# Patient Record
Sex: Female | Born: 1980
Health system: Southern US, Community
[De-identification: ages and names within clinical notes are randomized; demographics above are authoritative.]

## PROBLEM LIST (undated history)

## (undated) DIAGNOSIS — T7840XA Allergy, unspecified, initial encounter: Secondary | ICD-10-CM

## (undated) DIAGNOSIS — Z8619 Personal history of other infectious and parasitic diseases: Secondary | ICD-10-CM

## (undated) DIAGNOSIS — F419 Anxiety disorder, unspecified: Secondary | ICD-10-CM

## (undated) DIAGNOSIS — K219 Gastro-esophageal reflux disease without esophagitis: Secondary | ICD-10-CM

## (undated) DIAGNOSIS — O139 Gestational [pregnancy-induced] hypertension without significant proteinuria, unspecified trimester: Secondary | ICD-10-CM

## (undated) DIAGNOSIS — F32A Depression, unspecified: Secondary | ICD-10-CM

## (undated) DIAGNOSIS — D649 Anemia, unspecified: Secondary | ICD-10-CM

## (undated) HISTORY — DX: Personal history of other infectious and parasitic diseases: Z86.19

## (undated) HISTORY — PX: OTHER SURGICAL HISTORY: SHX169

## (undated) HISTORY — DX: Anxiety disorder, unspecified: F41.9

## (undated) HISTORY — PX: MELANOMA EXCISION: SHX5266

## (undated) HISTORY — DX: Anemia, unspecified: D64.9

## (undated) HISTORY — DX: Gestational (pregnancy-induced) hypertension without significant proteinuria, unspecified trimester: O13.9

## (undated) HISTORY — DX: Depression, unspecified: F32.A

## (undated) HISTORY — DX: Gastro-esophageal reflux disease without esophagitis: K21.9

## (undated) HISTORY — DX: Allergy, unspecified, initial encounter: T78.40XA

---

## 2005-11-16 ENCOUNTER — Emergency Department (HOSPITAL_COMMUNITY): Admission: EM | Admit: 2005-11-16 | Discharge: 2005-11-16 | Payer: Self-pay | Admitting: Emergency Medicine

## 2006-10-01 IMAGING — CT CT ABDOMEN W/ CM
2 of 5 series · 17 of 46 positions shown, 19 images · IV contrast (APPLIED)
Comparison: none

CLINICAL DATA: Right flank pain and right lower quadrant pain.  Nausea and vomiting.  Assess for appendicitis.  
 ABDOMEN CT WITH CONTRAST:
TECHNIQUE: Multidetector CT imaging of the abdomen was performed following the standard protocol during bolus administration of intravenous contrast.
 Contrast:  100 ml Omnipaque 300.
TECHNIQUE: Multidetector CT imaging of the pelvis was performed following the standard protocol during bolus administration of intravenous contrast.

[Series 2: abd/pelv with 5.0 b31f st · axial · 0.61mm/px · z∈[-480,-80]mm · 14 of 90 slices shown, 16 images]
[im 5/90  soft-tissue]
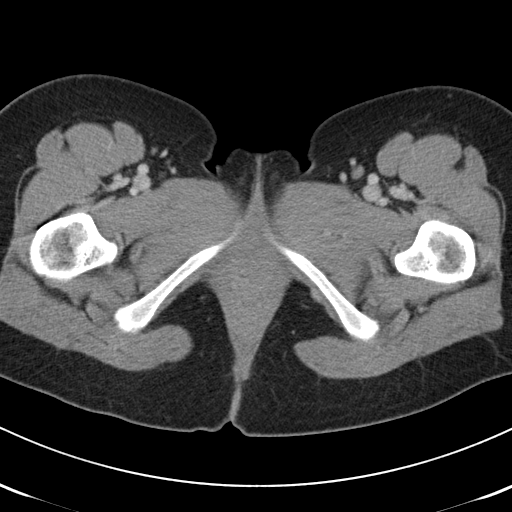
[im 5/90  bone]
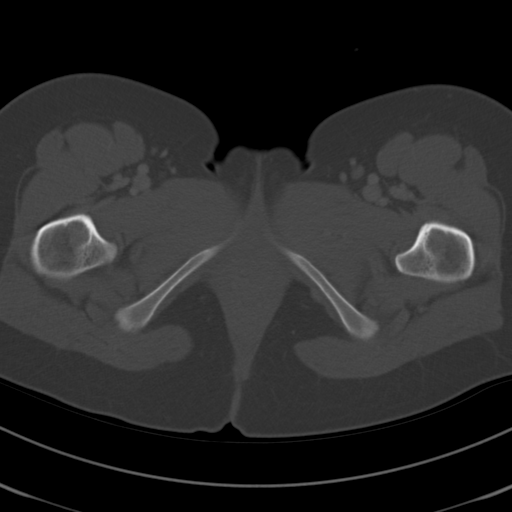
[im 14/90  soft-tissue]
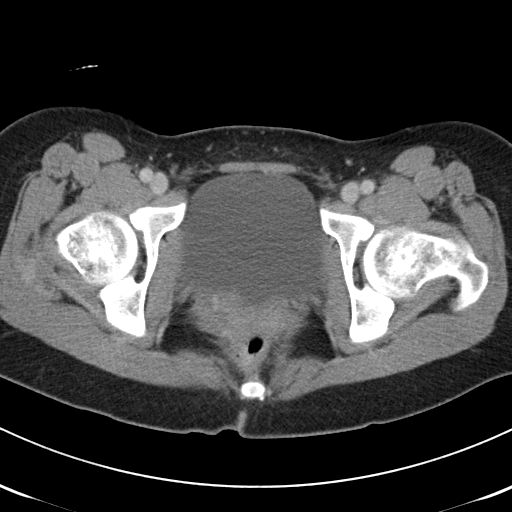
[im 18/90  soft-tissue]
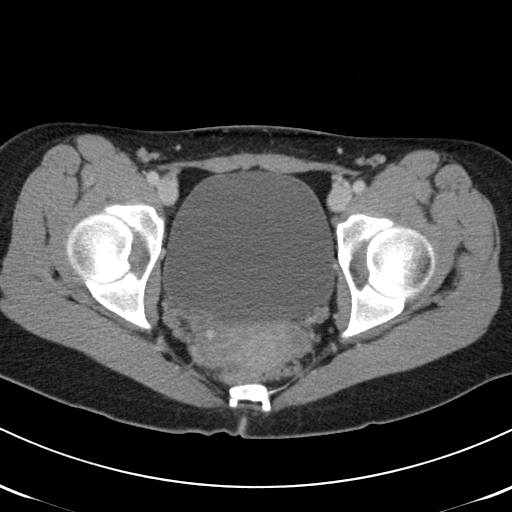
[im 23/90  soft-tissue]
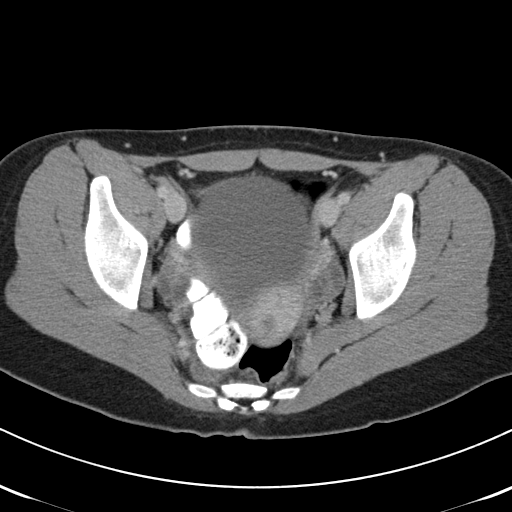
[im 32/90  soft-tissue]
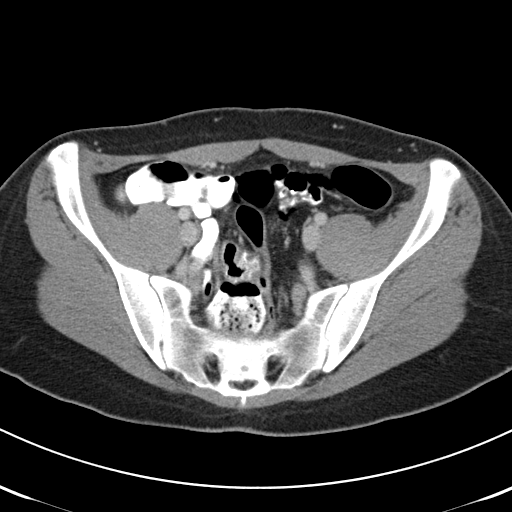
[im 36/90  soft-tissue]
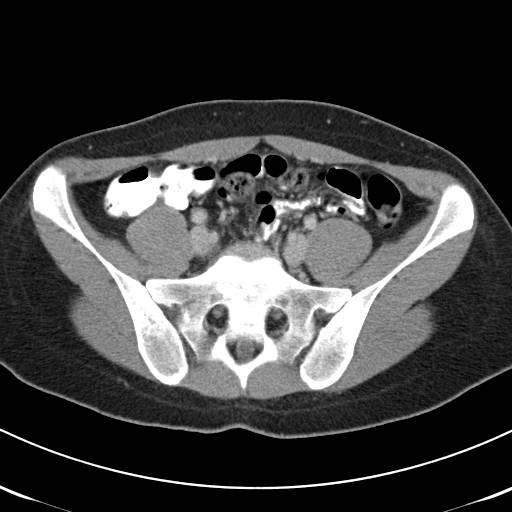
[im 41/90  soft-tissue]
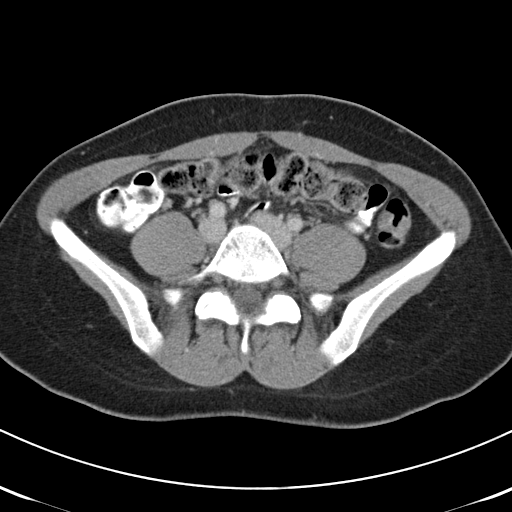
[im 49/90  soft-tissue]
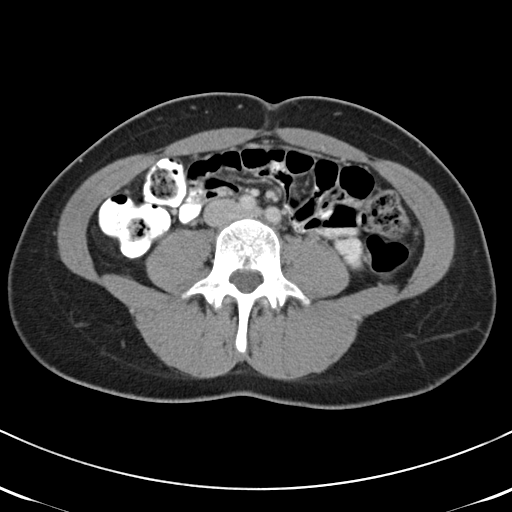
[im 54/90  soft-tissue]
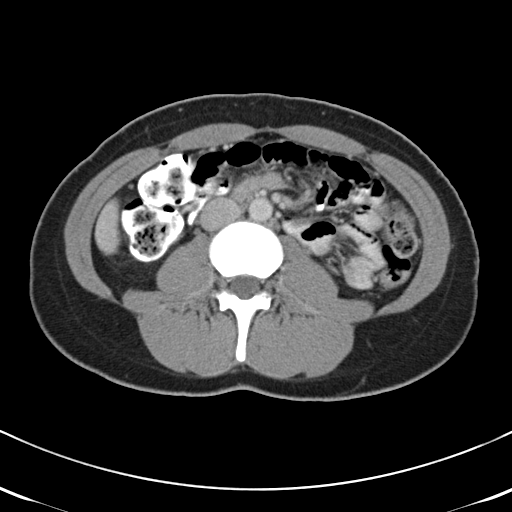
[im 54/90  bone]
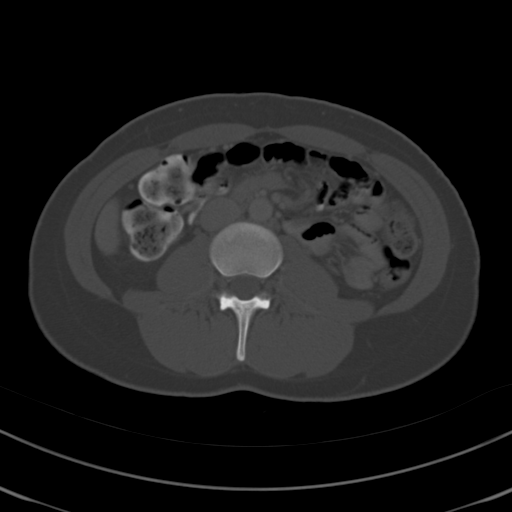
[im 58/90  soft-tissue]
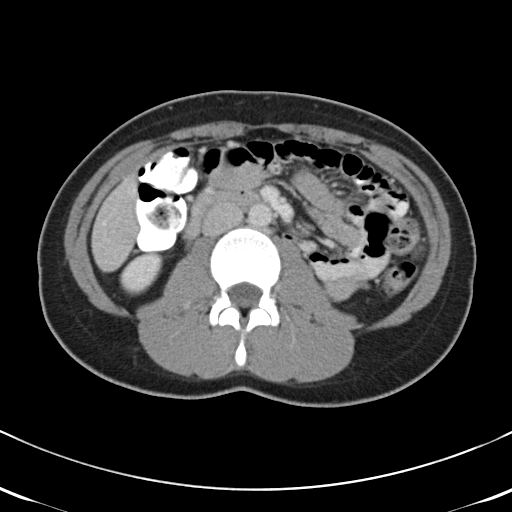
[im 67/90  soft-tissue]
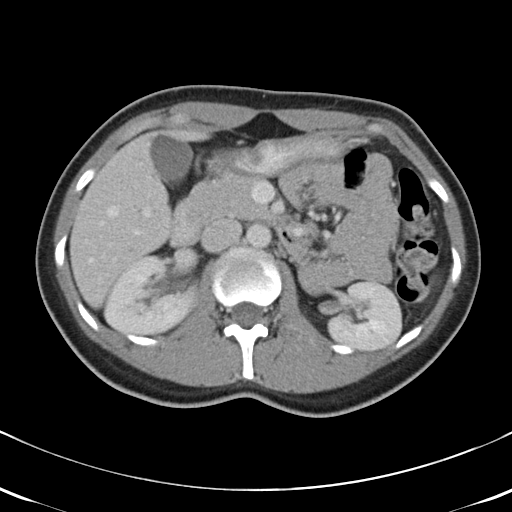
[im 72/90  soft-tissue]
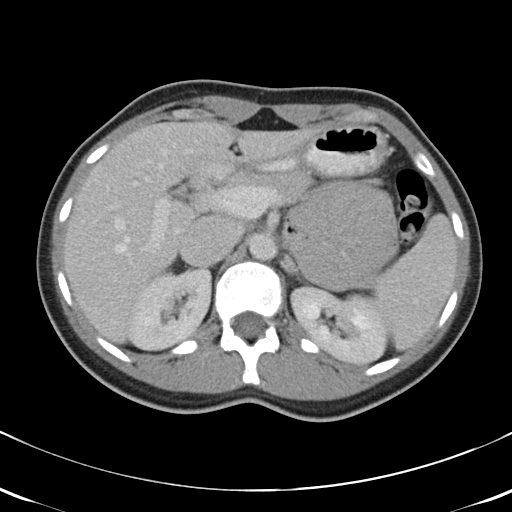
[im 76/90  soft-tissue]
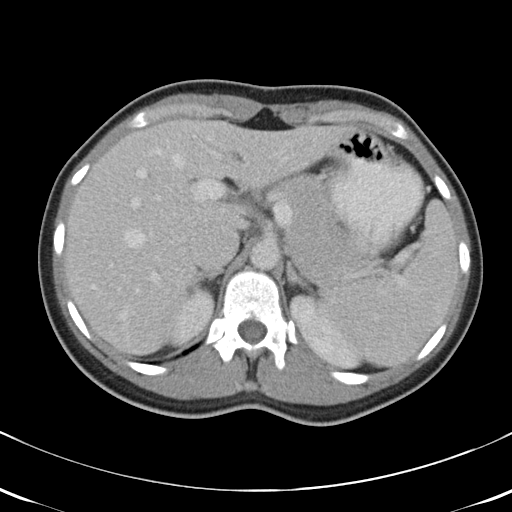
[im 85/90  soft-tissue]
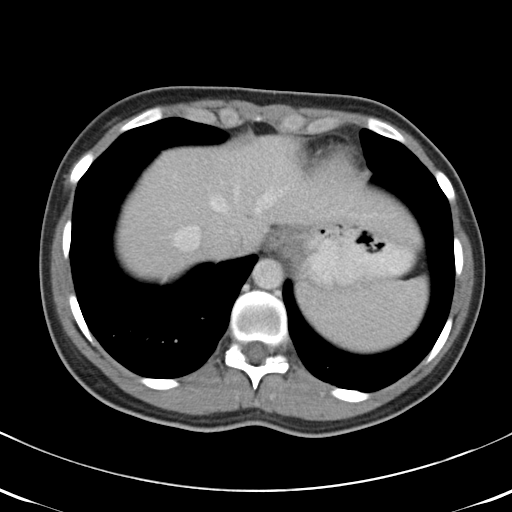

[Series 602: cor thins · coronal · 0.87mm/px · 3 of 98 slices shown]
[im 33/98  soft-tissue]
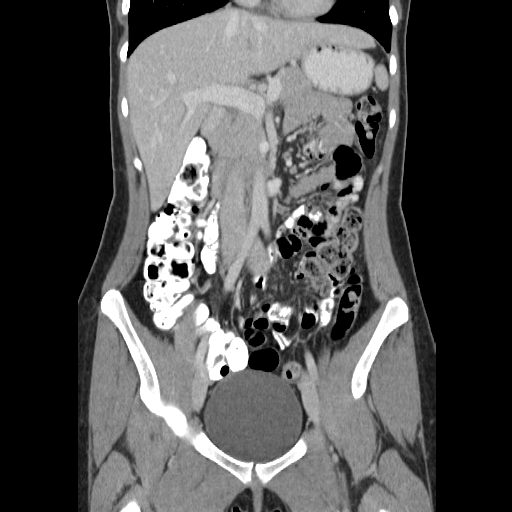
[im 44/98  soft-tissue]
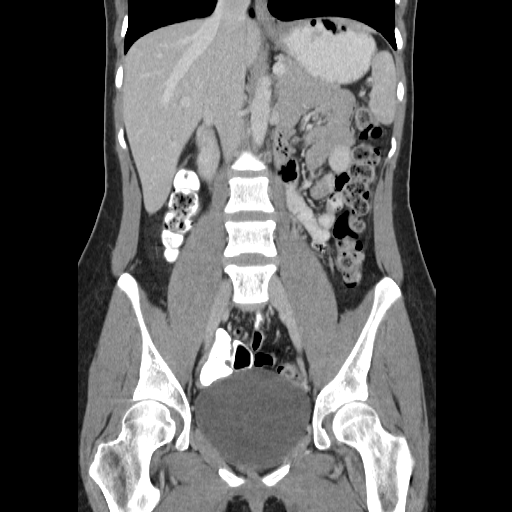
[im 54/98  soft-tissue]
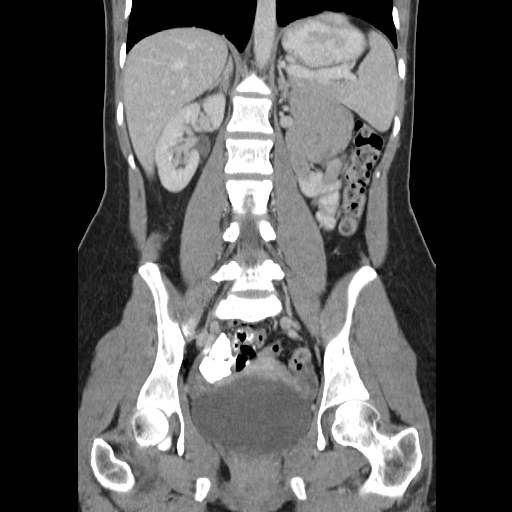

[17 of 46 positions shown; findings below may reference images not displayed]

FINDINGS: Lung bases are clear.  No pleural or pericardial fluid.  The liver has a normal appearance without focal lesions or biliary ductal dilatation.  No calcified gallstones.  The spleen is normal.  The pancreas is normal.  The adrenal glands are normal.  The kidneys are normal except for a nonobstructive stone in the upper pole on the right.  This measures about 2 to 3 mm.  Aorta and IVC are normal.  No free fluid or air.  No bowel pathology.
IMPRESSION: Normal CT scan of the abdomen with the exception of a non-obstructing 3 mm stone in the upper pole of the right kidney.  
 PELVIS CT WITH CONTRAST:
FINDINGS: There is a 3 to 4 mm calcification in the region of the UVJ on the right, which probably represents a stone.  This is not resulting in much hydroureteronephrosis however.  The uterus and adnexal regions are unremarkable.  There is no sign of pancreatitis.  There is a small amount of free fluid in the cul-de-sac.
IMPRESSION: 1.  3 to 4 mm stone in the region of the right UVJ.  No appreciable hydroureteronephrosis however.  
 2.  Small amount of free fluid in the cul-de-sac.  
 3.  No positive evidence of appendicitis.

## 2009-05-11 DIAGNOSIS — C801 Malignant (primary) neoplasm, unspecified: Secondary | ICD-10-CM

## 2009-05-11 HISTORY — DX: Malignant (primary) neoplasm, unspecified: C80.1

## 2009-08-09 LAB — CONVERTED CEMR LAB: Pap Smear: NORMAL

## 2010-02-26 ENCOUNTER — Ambulatory Visit: Payer: Self-pay | Admitting: Family Medicine

## 2010-02-26 DIAGNOSIS — E079 Disorder of thyroid, unspecified: Secondary | ICD-10-CM | POA: Insufficient documentation

## 2010-02-26 DIAGNOSIS — F988 Other specified behavioral and emotional disorders with onset usually occurring in childhood and adolescence: Secondary | ICD-10-CM | POA: Insufficient documentation

## 2010-02-26 DIAGNOSIS — C439 Malignant melanoma of skin, unspecified: Secondary | ICD-10-CM | POA: Insufficient documentation

## 2010-03-03 LAB — CONVERTED CEMR LAB: TSH: 1.49 microintl units/mL (ref 0.35–5.50)

## 2010-06-04 ENCOUNTER — Ambulatory Visit
Admission: RE | Admit: 2010-06-04 | Discharge: 2010-06-04 | Payer: Self-pay | Source: Home / Self Care | Attending: Family Medicine | Admitting: Family Medicine

## 2010-06-04 DIAGNOSIS — R21 Rash and other nonspecific skin eruption: Secondary | ICD-10-CM | POA: Insufficient documentation

## 2010-06-04 DIAGNOSIS — J069 Acute upper respiratory infection, unspecified: Secondary | ICD-10-CM | POA: Insufficient documentation

## 2010-06-10 NOTE — Assessment & Plan Note (Signed)
Summary: new to [email or web address].cbs   Vital Signs:  Patient profile:   30 year old female Height:      66.25 inches Weight:      150 pounds BMI:     24.12 Pulse rate:   88 / minute BP sitting:   116 / 70  (left arm)  Vitals Entered By: Doristine Devoid CMA (February 26, 2010 3:28 PM) CC: NEW EST- would like to have thyroid checked   History of Present Illness: 30 yo woman here today to establish care.  GYN- [facility].  Psych- Dr Tora Duck (ADD)  Oncologist- Dr Tama Gander.  Derm- [facility].  1) Melanoma- dx'd 5/11.  s/p surgery.  no chemo or radiation required.  following closely w/ derm.  2) ADD- tx'd w/ Ritalin.  sxs well controlled.  follows w/ psych  3) Elevated T4 (13.9)- found on labs run by Psych.  told to f/u w/ PCP.  rare palpitations.  tests were run due to insomnia.  TSH normal at 1.577.  Paternal Aunt w/ hyperthyroid.  Preventive Screening-Counseling & Management  Alcohol-Tobacco     Alcohol drinks/day: <1     Smoking Status: never  Caffeine-Diet-Exercise     Does Patient Exercise: yes      Sexual History:  currently monogamous.        Drug Use:  never.    Current Medications (verified): 1)  Yaz 3-0.02 Mg Tabs (Drospirenone-Ethinyl Estradiol) .... Take One Tablet Daily 2)  Ritalin 10 Mg Tabs (Methylphenidate Hcl) .... Take One Tablet Daily 3)  Propranolol Hcl 10 Mg Tabs (Propranolol Hcl) .... Take One Tablet Daily  Allergies (verified): No Known Drug Allergies  Past History:  Past Medical History: hx of chicken pox Melanoma ADD  Past Surgical History: melanoma removal wisdom teeth removed  Family History: Family History of Arthritis Family History Diabetes 1st degree relative Family History High cholesterol Family History Lung cancer- paternal grandfather  Social History: in a relationship works at [employer] in school for masters in public healthSmoking Status:  never Does Patient Exercise:  yes Drug Use:  never Sexual History:  currently  monogamous  Review of Systems      See HPI  Physical Exam  General:  Well-developed,well-nourished,in no acute distress; alert,appropriate and cooperative throughout examination Head:  Normocephalic and atraumatic without obvious abnormalities. No apparent alopecia or balding. Neck:  No deformities, masses, or tenderness noted. Lungs:  Normal respiratory effort, chest expands symmetrically. Lungs are clear to auscultation, no crackles or wheezes. Heart:  Normal rate and regular rhythm. S1 and S2 normal without gallop, murmur, click, rub or other extra sounds. Pulses:  +2 carotid, radial, DP Extremities:  no C/C/E Neurologic:  alert & oriented X3, cranial nerves II-XII intact, and gait normal.   Psych:  Cognition and judgment appear intact. Alert and cooperative with normal attention span and concentration. No apparent delusions, illusions, hallucinations   Impression & Recommendations:  Problem # 1:  UNSPECIFIED DISORDER OF THYROID (ICD-246.9) Assessment New pt's T4 was elevated on recent labs but mildly so and T3 and TSH were normal.  repeat labs today to see if endo referral or additional w/u is needed. Orders: Venipuncture (19147) Specimen Handling (82956) TLB-TSH (Thyroid Stimulating Hormone) (84443-TSH) TLB-T3, Free (Triiodothyronine) (84481-T3FREE) TLB-T4 (Thyrox), Free [phone])  Problem # 2:  MELANOMA (ICD-172.9) Assessment: New following regularly w/ derm.  no chemo or radiation required.  Problem # 3:  ADD (ICD-314.00) Assessment: New sxs well controlled.  currently seeing psych.  Complete Medication List: 1)  Yaz 3-0.02 Mg Tabs (Drospirenone-ethinyl estradiol) .... Take one tablet daily 2)  Ritalin 10 Mg Tabs (Methylphenidate hcl) .... Take one tablet daily 3)  Propranolol Hcl 10 Mg Tabs (Propranolol hcl) .... Take one tablet daily  Patient Instructions: 1)  Follow up in 2-3 months to repeat blood work (assuming that this lab work is normal) 2)  We'll notify  you of your lab results 3)  Please call with any questions or concerns 4)  Welcome!  We're glad to have you!!!   Orders Added: 1)  Venipuncture [16109] 2)  Specimen Handling [99000] 3)  TLB-TSH (Thyroid Stimulating Hormone) [84443-TSH] 4)  TLB-T3, Free (Triiodothyronine) [60454-U9WJXB] 5)  TLB-T4 (Thyrox), Free [14782-NF6O] 6)  New Patient Level III [13086]     Preventive Care Screening  Pap Smear:    Date:  08/09/2009    Results:  normal   Last Tetanus Booster:    Date:  12/09/2005    Results:  Historical

## 2010-06-18 NOTE — Assessment & Plan Note (Signed)
Summary: cold/kn   Vital Signs:  Patient profile:   30 year old female Weight:      151 pounds BMI:     24.28 Temp:     98.3 degrees F oral BP sitting:   104 / 60  (left arm)  Vitals Entered By: Doristine Devoid CMA (June 04, 2010 9:39 AM) CC: cough and sinus congetion x4 days and rash on arm and legs    History of Present Illness: 30 yo woman here today for 5 days of cold sxs.  started w/ sore throat.  progressed to nasal congestion, cough, sneezing, headaches.  having facial pressure.  low grade temps.  + sick contacts.  cough is productive.  taking OCT multisymptom cold med w/out relief.  rash- appeared today.  on R lower leg and R elbow.  has itching on the leg, none on the elbow.  no exposure to pets.  no change in detergents, no new foods.  took vit C supplement last night for 1st time.  Current Medications (verified): 1)  Yaz 3-0.02 Mg Tabs (Drospirenone-Ethinyl Estradiol) .... Take One Tablet Daily 2)  Ritalin 10 Mg Tabs (Methylphenidate Hcl) .... Take One Tablet Daily 3)  Propranolol Hcl 10 Mg Tabs (Propranolol Hcl) .... Take One Tablet Daily  Allergies (verified): No Known Drug Allergies  Review of Systems      See HPI  Physical Exam  General:  Well-developed,well-nourished,in no acute distress; alert,appropriate and cooperative throughout examination Head:  Normocephalic and atraumatic without obvious abnormalities. No apparent alopecia or balding.  no TTP over sinuses Eyes:  no injxn or inflammation Ears:  External ear exam shows no significant lesions or deformities.  Otoscopic examination reveals clear canals, tympanic membranes are intact bilaterally without bulging, retraction, inflammation or discharge. Hearing is grossly normal bilaterally. Nose:  + congestion Mouth:  + PND, otherwise normal Neck:  No deformities, masses, or tenderness noted. Lungs:  Normal respiratory effort, chest expands symmetrically. Lungs are clear to auscultation, no crackles or  wheezes.  + dry cough Heart:  Normal rate and regular rhythm. S1 and S2 normal without gallop, murmur, click, rub or other extra sounds. Skin:  fine macular-papular rash on R lateral calf and R elbow.  no excoriations, no vesicles. Cervical Nodes:  No lymphadenopathy noted   Impression & Recommendations:  Problem # 1:  URI (ICD-465.9) Assessment New pt's sxs consistent w/ viral illness.  cough meds as needed.  reviewed supportive care and red flags that should prompt return.  Pt expresses understanding and is in agreement w/ this plan. Her updated medication list for this problem includes:    Cheratussin Ac 100-10 Mg/25ml Syrp (Guaifenesin-codeine) .Marland Kitchen... 1-2 tsps q4-6 as needed.  will cause drowsiness.  disp    Tessalon 200 Mg Caps (Benzonatate) .Marland Kitchen... Take one capsule by mouth three times a day as needed for cough  Problem # 2:  RASH-NONVESICULAR (ICD-782.1) Assessment: New cause of rash not readily identifiable.  start steroid cream on affected areas. Her updated medication list for this problem includes:    Triamcinolone Acetonide 0.1 % Oint (Triamcinolone acetonide) .Marland Kitchen... Apply to affected area two times a day.  disp 1 large tube  Complete Medication List: 1)  Yaz 3-0.02 Mg Tabs (Drospirenone-ethinyl estradiol) .... Take one tablet daily 2)  Ritalin 10 Mg Tabs (Methylphenidate hcl) .... Take one tablet daily 3)  Propranolol Hcl 10 Mg Tabs (Propranolol hcl) .... Take one tablet daily 4)  Triamcinolone Acetonide 0.1 % Oint (Triamcinolone acetonide) .... Apply to affected  area two times a day.  disp 1 large tube 5)  Cheratussin Ac 100-10 Mg/55ml Syrp (Guaifenesin-codeine) .Marland Kitchen.. 1-2 tsps q4-6 as needed.  will cause drowsiness.  disp 6)  Tessalon 200 Mg Caps (Benzonatate) .... Take one capsule by mouth three times a day as needed for cough  Patient Instructions: 1)  Follow up as needed. 2)  Use the triamcinolone on the rash 3)  Add Mucinex to thin your congestion 4)  Use the  cough meds as needed 5)  Drink plenty of fluids 6)  REST! 7)  Tylenol or ibuprofen for fever or headaches 8)  Call with any questions or concerns 9)  Hang in there! Prescriptions: TESSALON 200 MG CAPS (BENZONATATE) Take one capsule by mouth three times a day as needed for cough  #60 x 0   Entered and Authorized by:   Neena Rhymes MD   Signed by:   Neena Rhymes MD on 06/04/2010   Method used:   Electronically to        [id number] (retail)       [address]       [place], [place]  [address]       Ph: [phone] or [phone]       Fax: [phone]   RxID:   [id number] CHERATUSSIN AC 100-10 MG/5ML SYRP (GUAIFENESIN-CODEINE) 1-2 tsps Q4-6 as needed.  will cause drowsiness.  disp  #150 x 0   Entered and Authorized by:   Neena Rhymes MD   Signed by:   Neena Rhymes MD on 06/04/2010   Method used:   Print then Give to Patient   RxID:   [id number] TRIAMCINOLONE ACETONIDE 0.1 % OINT (TRIAMCINOLONE ACETONIDE) apply to affected area two times a day.  disp 1 large tube  #1 x 0   Entered and Authorized by:   Neena Rhymes MD   Signed by:   Neena Rhymes MD on 06/04/2010   Method used:   Electronically to        [id number] (retail)       [address]       [place], [place]  [address]       Ph: [phone] or [phone]       Fax: [phone]   RxID:   [id number]    Orders Added: 1)  Est. Patient Level III [41962]

## 2010-09-25 ENCOUNTER — Emergency Department (HOSPITAL_COMMUNITY)
Admission: EM | Admit: 2010-09-25 | Discharge: 2010-09-26 | Disposition: A | Discharge status: Home / Self Care | Payer: BC Managed Care – PPO | Attending: Emergency Medicine | Admitting: Emergency Medicine

## 2010-09-25 DIAGNOSIS — R509 Fever, unspecified: Secondary | ICD-10-CM | POA: Insufficient documentation

## 2010-09-25 DIAGNOSIS — R112 Nausea with vomiting, unspecified: Secondary | ICD-10-CM | POA: Insufficient documentation

## 2010-09-25 DIAGNOSIS — Z87442 Personal history of urinary calculi: Secondary | ICD-10-CM | POA: Insufficient documentation

## 2010-09-25 DIAGNOSIS — N12 Tubulo-interstitial nephritis, not specified as acute or chronic: Secondary | ICD-10-CM | POA: Insufficient documentation

## 2010-09-25 DIAGNOSIS — R109 Unspecified abdominal pain: Secondary | ICD-10-CM | POA: Insufficient documentation

## 2010-09-25 LAB — DIFFERENTIAL
Basophils Absolute: 0 10*3/uL (ref 0.0–0.1)
Basophils Relative: 0 % (ref 0–1)
Lymphocytes Relative: 6 % — ABNORMAL LOW (ref 12–46)
Lymphs Abs: 0.9 10*3/uL (ref 0.7–4.0)
Monocytes Absolute: 0.6 10*3/uL (ref 0.1–1.0)
Monocytes Relative: 4 % (ref 3–12)
Neutrophils Relative %: 90 % — ABNORMAL HIGH (ref 43–77)

## 2010-09-25 LAB — COMPREHENSIVE METABOLIC PANEL
ALT: 18 U/L (ref 0–35)
AST: 23 U/L (ref 0–37)
Alkaline Phosphatase: 97 U/L (ref 39–117)
BUN: 9 mg/dL (ref 6–23)
CO2: 25 mEq/L (ref 19–32)
Calcium: 9.5 mg/dL (ref 8.4–10.5)
Chloride: 99 mEq/L (ref 96–112)
Potassium: 4 mEq/L (ref 3.5–5.1)
Sodium: 135 mEq/L (ref 135–145)
Total Bilirubin: 0.6 mg/dL (ref 0.3–1.2)
Total Protein: 7.8 g/dL (ref 6.0–8.3)

## 2010-09-25 LAB — CBC
HCT: 37.7 % (ref 36.0–46.0)
MCHC: 32.1 g/dL (ref 30.0–36.0)
MCV: 83.2 fL (ref 78.0–100.0)
Platelets: 129 10*3/uL — ABNORMAL LOW (ref 150–400)
RBC: 4.53 MIL/uL (ref 3.87–5.11)
WBC: 14.6 10*3/uL — ABNORMAL HIGH (ref 4.0–10.5)

## 2010-09-25 LAB — POCT PREGNANCY, URINE: Preg Test, Ur: NEGATIVE

## 2010-09-25 LAB — URINALYSIS, ROUTINE W REFLEX MICROSCOPIC
Bilirubin Urine: NEGATIVE
Nitrite: POSITIVE — AB
Protein, ur: 30 mg/dL — AB
pH: 5.5 (ref 5.0–8.0)

## 2010-09-25 LAB — URINE MICROSCOPIC-ADD ON

## 2010-10-02 ENCOUNTER — Other Ambulatory Visit (HOSPITAL_COMMUNITY): Payer: Self-pay | Admitting: Urology

## 2010-10-02 DIAGNOSIS — N12 Tubulo-interstitial nephritis, not specified as acute or chronic: Secondary | ICD-10-CM

## 2010-10-07 ENCOUNTER — Ambulatory Visit (HOSPITAL_COMMUNITY)
Admission: RE | Admit: 2010-10-07 | Discharge: 2010-10-07 | Disposition: A | Discharge status: Home / Self Care | Payer: BC Managed Care – PPO | Source: Ambulatory Visit | Attending: Urology | Admitting: Urology

## 2010-10-07 DIAGNOSIS — N12 Tubulo-interstitial nephritis, not specified as acute or chronic: Secondary | ICD-10-CM

## 2010-10-07 DIAGNOSIS — N390 Urinary tract infection, site not specified: Secondary | ICD-10-CM | POA: Insufficient documentation

## 2010-10-07 MED ORDER — DIATRIZOATE MEGLUMINE 18 % UR SOLN: 400.0000 mL | Freq: Once | URETHRAL | Status: AC | PRN

## 2012-09-20 ENCOUNTER — Ambulatory Visit (INDEPENDENT_AMBULATORY_CARE_PROVIDER_SITE_OTHER): Payer: BC Managed Care – PPO | Admitting: Family Medicine

## 2012-09-20 ENCOUNTER — Encounter: Payer: Self-pay | Admitting: Family Medicine

## 2012-09-20 VITALS — BP 116/68 | HR 88 | Temp 99.6°F | Wt 152.8 lb

## 2012-09-20 DIAGNOSIS — R059 Cough, unspecified: Secondary | ICD-10-CM

## 2012-09-20 DIAGNOSIS — J019 Acute sinusitis, unspecified: Secondary | ICD-10-CM

## 2012-09-20 DIAGNOSIS — R05 Cough: Secondary | ICD-10-CM

## 2012-09-20 MED ORDER — MOMETASONE FUROATE 50 MCG/ACT NA SUSP: 2.0000 | Freq: Every day | NASAL | Status: DC

## 2012-09-20 MED ORDER — GUAIFENESIN-CODEINE 100-10 MG/5ML PO SYRP: 5.0000 mL | ORAL_SOLUTION | Freq: Three times a day (TID) | ORAL | Status: DC | PRN

## 2012-09-20 MED ORDER — CEFUROXIME AXETIL 500 MG PO TABS: 500.0000 mg | ORAL_TABLET | Freq: Two times a day (BID) | ORAL | Status: AC

## 2012-09-20 NOTE — Patient Instructions (Signed)

## 2012-09-20 NOTE — Progress Notes (Signed)
  Subjective:     Shannon Richardson is a 32 y.o. female who presents for evaluation of sinus pain. Symptoms include: congestion, facial pain, fevers, headaches, nasal congestion and sinus pressure. Onset of symptoms was 4 days ago. Symptoms have been gradually worsening since that time. Past history is significant for no history of pneumonia or bronchitis. Patient is a non-smoker.  The following portions of the patient's history were reviewed and updated as appropriate: allergies, current medications, past family history, past medical history, past social history, past surgical history and problem list.  Review of Systems Pertinent items are noted in HPI.   Objective:    BP 116/68  Pulse 88  Temp(Src) 99.6 F (37.6 C) (Oral)  Wt 152 lb 12.8 oz (69.31 kg)  BMI 24.47 kg/m2  SpO2 96% General appearance: alert, cooperative, appears stated age and no distress Ears: normal TM's and external ear canals both ears Nose: green discharge, moderate congestion, turbinates red, swollen, sinus tenderness bilateral Throat: lips, mucosa, and tongue normal; teeth and gums normal Neck: mild anterior cervical adenopathy, supple, symmetrical, trachea midline and thyroid not enlarged, symmetric, no tenderness/mass/nodules Lungs: clear to auscultation bilaterally Heart: S1, S2 normal    Assessment:    Acute bacterial sinusitis.    Plan:    Nasal steroids per medication orders. Antihistamines per medication orders. Ceftin per medication orders. f/u prn

## 2013-08-10 ENCOUNTER — Ambulatory Visit (INDEPENDENT_AMBULATORY_CARE_PROVIDER_SITE_OTHER): Payer: BC Managed Care – PPO | Admitting: Family Medicine

## 2013-08-10 ENCOUNTER — Encounter: Payer: Self-pay | Admitting: Family Medicine

## 2013-08-10 VITALS — BP 124/84 | HR 89 | Temp 98.5°F | Resp 17 | Wt 165.5 lb

## 2013-08-10 DIAGNOSIS — R5383 Other fatigue: Principal | ICD-10-CM

## 2013-08-10 DIAGNOSIS — R5381 Other malaise: Secondary | ICD-10-CM | POA: Insufficient documentation

## 2013-08-10 LAB — CBC WITH DIFFERENTIAL/PLATELET
BASOS ABS: 0 10*3/uL (ref 0.0–0.1)
Basophils Relative: 0.2 % (ref 0.0–3.0)
EOS ABS: 0.1 10*3/uL (ref 0.0–0.7)
EOS PCT: 2.2 % (ref 0.0–5.0)
HCT: 36 % (ref 36.0–46.0)
HEMOGLOBIN: 11.8 g/dL — AB (ref 12.0–15.0)
LYMPHS ABS: 1.8 10*3/uL (ref 0.7–4.0)
Lymphocytes Relative: 27.9 % (ref 12.0–46.0)
MCHC: 32.8 g/dL (ref 30.0–36.0)
MCV: 82.6 fl (ref 78.0–100.0)
MONOS PCT: 8.6 % (ref 3.0–12.0)
Monocytes Absolute: 0.6 10*3/uL (ref 0.1–1.0)
NEUTROS ABS: 3.9 10*3/uL (ref 1.4–7.7)
NEUTROS PCT: 61.1 % (ref 43.0–77.0)
PLATELETS: 218 10*3/uL (ref 150.0–400.0)
RBC: 4.36 Mil/uL (ref 3.87–5.11)
RDW: 14.4 % (ref 11.5–14.6)
WBC: 6.4 10*3/uL (ref 4.5–10.5)

## 2013-08-10 LAB — BASIC METABOLIC PANEL
BUN: 11 mg/dL (ref 6–23)
CHLORIDE: 102 meq/L (ref 96–112)
CO2: 27 mEq/L (ref 19–32)
CREATININE: 0.9 mg/dL (ref 0.4–1.2)
Calcium: 9.2 mg/dL (ref 8.4–10.5)
GFR: 73.8 mL/min (ref >60.00)
GLUCOSE: 86 mg/dL (ref 70–99)
Potassium: 4 mEq/L (ref 3.5–5.1)
Sodium: 138 mEq/L (ref 135–145)

## 2013-08-10 LAB — TSH: TSH: 1.27 u[IU]/mL (ref 0.35–5.50)

## 2013-08-10 NOTE — Progress Notes (Signed)
Pre visit review using our clinic review tool, if applicable. No additional management support is needed unless otherwise documented below in the visit note. 

## 2013-08-10 NOTE — Patient Instructions (Signed)
Follow up as needed We'll notify you of your lab results and make any changes if needed Try and get lots of rest, regular exercise to improve fatigue Call with any questions or concerns Hang in there!

## 2013-08-10 NOTE — Progress Notes (Signed)
   Subjective:    Patient ID: Shannon Richardson, female    DOB: [birth year], 33 y.o.   MRN: [id number]  HPI Pt is concerned about a cluster of symptoms- fatigue, facial acne, weight gain- 15 lbs in a year.  + constipation.  + dry skin, hair.  + family hx of hypothyroid.  Regular menstrual cycles.  Sleeping well at night.  Pt admits to mild depression but 'i'm feeling better now that it's sunny'.   Review of Systems For ROS see HPI     Objective:   Physical Exam  Vitals reviewed. Constitutional: She is oriented to person, place, and time. She appears well-developed and well-nourished. No distress.  HENT:  Head: Normocephalic and atraumatic.  Eyes: Conjunctivae and EOM are normal. Pupils are equal, round, and reactive to light.  Neck: Normal range of motion. Neck supple. No thyromegaly present.  Cardiovascular: Normal rate, regular rhythm, normal heart sounds and intact distal pulses.   No murmur heard. Pulmonary/Chest: Effort normal and breath sounds normal. No respiratory distress.  Musculoskeletal: She exhibits no edema.  Lymphadenopathy:    She has no cervical adenopathy.  Neurological: She is alert and oriented to person, place, and time.  Skin: Skin is warm and dry.  Psychiatric: She has a normal mood and affect. Her behavior is normal.          Assessment & Plan:

## 2013-08-10 NOTE — Assessment & Plan Note (Signed)
Pt's sxs possibly multifactorial- depression, hypothyroid, Vit D deficiency (since pt avoids sun due to hx of melanoma).  Check labs.  Replete prn.

## 2013-08-16 ENCOUNTER — Encounter: Payer: Self-pay | Admitting: General Practice

## 2013-08-16 LAB — VITAMIN D 1,25 DIHYDROXY
VITAMIN D 1, 25 (OH) TOTAL: 71 pg/mL (ref 18–72)
Vitamin D3 1, 25 (OH)2: 71 pg/mL

## 2014-03-15 DIAGNOSIS — F4312 Post-traumatic stress disorder, chronic: Secondary | ICD-10-CM | POA: Insufficient documentation

## 2014-03-15 DIAGNOSIS — F4011 Social phobia, generalized: Secondary | ICD-10-CM | POA: Insufficient documentation

## 2014-12-24 LAB — OB RESULTS CONSOLE GC/CHLAMYDIA
CHLAMYDIA, DNA PROBE: NEGATIVE
Gonorrhea: NEGATIVE

## 2015-02-12 LAB — OB RESULTS CONSOLE RUBELLA ANTIBODY, IGM: Rubella: IMMUNE

## 2015-02-12 LAB — OB RESULTS CONSOLE ANTIBODY SCREEN: Antibody Screen: NEGATIVE

## 2015-02-12 LAB — OB RESULTS CONSOLE RPR: RPR: NONREACTIVE

## 2015-02-12 LAB — OB RESULTS CONSOLE HEPATITIS B SURFACE ANTIGEN: Hepatitis B Surface Ag: NEGATIVE

## 2015-02-12 LAB — OB RESULTS CONSOLE ABO/RH: RH Type: NEGATIVE

## 2015-02-12 LAB — OB RESULTS CONSOLE HIV ANTIBODY (ROUTINE TESTING): HIV: NONREACTIVE

## 2015-03-08 ENCOUNTER — Other Ambulatory Visit (HOSPITAL_COMMUNITY): Payer: Self-pay | Admitting: Obstetrics & Gynecology

## 2015-03-08 DIAGNOSIS — O283 Abnormal ultrasonic finding on antenatal screening of mother: Secondary | ICD-10-CM

## 2015-03-08 DIAGNOSIS — Z3689 Encounter for other specified antenatal screening: Secondary | ICD-10-CM

## 2015-03-12 ENCOUNTER — Ambulatory Visit (HOSPITAL_COMMUNITY)
Admission: RE | Admit: 2015-03-12 | Discharge: 2015-03-12 | Disposition: A | Discharge status: Home / Self Care | Payer: BLUE CROSS/BLUE SHIELD | Source: Ambulatory Visit | Attending: Obstetrics & Gynecology | Admitting: Obstetrics & Gynecology

## 2015-03-12 ENCOUNTER — Encounter (HOSPITAL_COMMUNITY): Payer: Self-pay

## 2015-03-12 DIAGNOSIS — O358XX0 Maternal care for other (suspected) fetal abnormality and damage, not applicable or unspecified: Secondary | ICD-10-CM

## 2015-03-12 DIAGNOSIS — Z3689 Encounter for other specified antenatal screening: Secondary | ICD-10-CM

## 2015-03-12 DIAGNOSIS — Z36 Encounter for antenatal screening of mother: Secondary | ICD-10-CM | POA: Diagnosis not present

## 2015-03-12 DIAGNOSIS — Z3A21 21 weeks gestation of pregnancy: Secondary | ICD-10-CM | POA: Diagnosis not present

## 2015-03-12 DIAGNOSIS — O283 Abnormal ultrasonic finding on antenatal screening of mother: Secondary | ICD-10-CM

## 2015-03-12 DIAGNOSIS — O35HXX0 Maternal care for other (suspected) fetal abnormality and damage, fetal lower extremities anomalies, not applicable or unspecified: Secondary | ICD-10-CM

## 2015-03-14 DIAGNOSIS — O35HXX0 Maternal care for other (suspected) fetal abnormality and damage, fetal lower extremities anomalies, not applicable or unspecified: Secondary | ICD-10-CM | POA: Insufficient documentation

## 2015-03-14 DIAGNOSIS — O358XX0 Maternal care for other (suspected) fetal abnormality and damage, not applicable or unspecified: Secondary | ICD-10-CM | POA: Insufficient documentation

## 2015-03-14 NOTE — Progress Notes (Signed)
Genetic Counseling  High-Risk Gestation Note  Appointment Date:  03/12/2015 Referred By: Princess Bruins, MD Date of Birth:  [birth year]   Pregnancy History: G1P0 Estimated Date of Delivery: 07/22/15 Estimated Gestational Age: [redacted]w[redacted]d Attending: Benjaman Lobe, MD  I met with Ms. Shannon Richardson for genetic counseling because of abnormal ultrasound findings. She was accompanied by her sister to today's visit. [employer] Genetic Counseling Intern, Silva Bandy, assisted with genetic counseling under my direct supervision.   In Summary:  Ultrasound today visualized left club foot. Remaining visualized fetal anatomy visualized and within normal limits  Reviewed increased association with underlying chromosome or genetic conditions versus chance for club foot to be isolated  Patient previously had first trimester screening within normal range  Declined NIPS and amniocentesis  Declined CF carrier screening   Follow-up ultrasound scheduled for 04/23/15  Prenatal pediatric orthopedics consult scheduled for 11/08 with [place] office  We began by reviewing the ultrasound in detail. Ultrasound today visualized left club foot. Fetal bowel visualized to be prominent but not considered echogenic on today's exam. Remaining visualized fetal anatomy appeared normal. Complete ultrasound results reported separately.   We discussed that clubfoot/feet is a term that actually describes three distinct anomalies (talipes equinovarus, talipes calcaneovalgus, and metatarsus varus) and occurs in 1 in 1000 births. The most common type of clubfeet, talipes equinovarus, is characterized by forefoot adduction with supination, heel varus, and ankle equines, which cannot be brought back to a neutral position. They were counseled that clubfeet can be an isolated difference, occur as a feature of an underlying syndrome, or the result of neurological impairment. We discussed that the most likely mode of  inheritance for nonsyndromic, isolated clubfoot/feet is multifactorial. There is a known genetic component for multifactorial clubfoot, as demonstrated by twin studies; environmental factors also play a role, including infection, drugs, and intrauterine environment (oligohydramnios, fetal positioning). While the majority of cases of clubfoot/feet are isolated, it is a feature in more than 200 known genetic syndromes, including both chromosomal and single gene conditions. We reviewed chromosomes, nondisjunction and the features of Down syndrome, trisomy 7, and 11. We discussed that the risk for other chromosome aberrations is slightly increased (microdeletions, microduplications, insertions, translocations). We reviewed single gene conditions including common inheritance patterns and associated risks for recurrence. We reviewed the normal results of Ms. Markiewicz's First trimester screen and the associated reduction in risks for fetal aneuploidy (< 1 in 10,000 for Trisomies 21, 18, 13). We also reviewed her normal MSAFP screening results and the associated screen negative risks for an ONTD.   Ms. Jedlicka was counseled regarding the option of noninvasive prenatal screening (NIPS)/cell free fetal DNA (cffDNA) testing. We reviewed that although highly specific and sensitive, this testing is not considered to be diagnostic and does not detect all chromosome aberrations. We reviewed the detection and false positive rates of NIPS. We then discussed the option of amniocentesis, including the limitations, benefits, and risks. They understand that amniocentesis allows evaluation of the fetal chromosomes, but cannot detect all genetic conditions. Specifically, we discussed that single gene conditions are difficult to diagnose prenatally unless a specific condition is suspected based on additional ultrasound findings or family history. Additionally, we discussed the availability of microarray analysis, which can be performed  pre and postnatally. They were counseled that microarray analysis is a molecular based technique in which a test sample of DNA (fetal) is compared to a reference (normal) genome in order to determine if the test sample has any extra or missing  genetic information. Microarray analysis allows for the detection of genetic deletions and duplications that are 485 times smaller than those identified by routine chromosome analysis.After thoughtful consideration, Ms. Jonai Whiteman declined NIPS and amniocentesis.   We discussed the option of meeting with a pediatric orthopedic specialist to discuss expectant management and treatment of clubfeet.  Ms. Shirelle Smithey is interested in this option. We facilitated a prenatal consult with [place] office in [place], which is scheduled for 03/19/15. Follow-up ultrasound is scheduled for 04/23/15.   Both family histories were reviewed and found to be contributory for congenital cataracts for the patient's maternal half-sister's daughter. She is currently 68 years old and otherwise healthy. The patient's maternal half-sister reported that she is a carrier and that they were told it was a 1 in 4 chance for the condition to happen again. She did not remember that specific testing or name of the condition that was determined. 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 We reviewed genes and autosomal recessive inheritance. If the patient's maternal half-sister is a carrier for an autosomal recessive form of congenital cataracts, then Ms. Birmingham would have a 1 in 4 chance to also be a carrier. The father of the pregnancy would have the general population risk given no known family history and no consanguinity to Ms. Skowron. Overall recurrence risk is likely low. However, additional information is needed regarding the specific condition in order to accurately assess  recurrence risk for relatives. Carrier testing would potentially be available for Ms. Shannahan if desired and if the specific testing performed for the relatives was known.  Additionally, the father of the pregnancy reportedly has a female paternal first cousin (his maternal uncle's daughter) who has intellectual disability and other health concerns. The patient had limited information regarding the specific types of medical issues and did not know of a specific cause. She is currently in her 61's. We discussed that there can be genetic, environmental, and multifactorial causes for intellectual disabilities. The reported family history is suggestive of a non-isolated form. However, additional information is needed to assess recurrence risk for relatives.  Without further information regarding the provided family history, an accurate genetic risk cannot be calculated. Further genetic counseling is warranted if more information is obtained.  Ms. Khloei Cousineau  was provided with written information regarding cystic fibrosis (CF) including the carrier frequency and incidence in the Caucasian and African American populations, the availability of carrier testing and prenatal diagnosis if indicated.  In addition, we discussed that CF is routinely screened for as part of the [facility] newborn screening panel.  She declined CF testing today.   Ms. Contino denied exposure to environmental toxins or chemical agents. She denied the use of tobacco or street drugs. She reported having 3-4 drinks of alcohol prior to being aware of the pregnancy. She also reported taking adderall and trazodone prior to being aware of the pregnancy. The all-or-none period was discussed, meaning exposures that occur in the first 4 weeks of gestation are typically thought to either not affect the pregnancy at all or result in a miscarriage.  Prenatal alcohol exposure can increase the risk for growth delays, small head size, heart defects, eye and  facial differences, as well as behavior problems and learning disabilities. The risk of these to occur tends to increase with the amount of alcohol consumed. However, because there is no identified safe amount of alcohol in pregnancy, it  is recommended to completely avoid alcohol in pregnancy. Given the reported amount of exposure, risk for associated effects are likely low in the current pregnancy.  She denied significant viral illnesses during the course of her pregnancy. Her medical and surgical histories were noncontributory.   I counseled Ms. Raetta Wickham regarding the above risks and available options.  The approximate face-to-face time with the genetic counselor was 45 minutes.  Chipper Oman, MS Certified Genetic Counselor 03/14/2015

## 2015-04-18 ENCOUNTER — Encounter (HOSPITAL_COMMUNITY): Payer: Self-pay

## 2015-04-18 ENCOUNTER — Other Ambulatory Visit (HOSPITAL_COMMUNITY): Payer: Self-pay

## 2015-04-23 ENCOUNTER — Other Ambulatory Visit (HOSPITAL_COMMUNITY): Payer: Self-pay | Admitting: Maternal and Fetal Medicine

## 2015-04-23 ENCOUNTER — Ambulatory Visit (HOSPITAL_COMMUNITY)
Admission: RE | Admit: 2015-04-23 | Discharge: 2015-04-23 | Disposition: A | Discharge status: Home / Self Care | Payer: BLUE CROSS/BLUE SHIELD | Source: Ambulatory Visit | Attending: Family Medicine | Admitting: Family Medicine

## 2015-04-23 ENCOUNTER — Encounter (HOSPITAL_COMMUNITY): Payer: Self-pay

## 2015-04-23 DIAGNOSIS — Z3A27 27 weeks gestation of pregnancy: Secondary | ICD-10-CM

## 2015-04-23 DIAGNOSIS — O358XX0 Maternal care for other (suspected) fetal abnormality and damage, not applicable or unspecified: Secondary | ICD-10-CM | POA: Diagnosis not present

## 2015-04-23 DIAGNOSIS — O35HXX0 Maternal care for other (suspected) fetal abnormality and damage, fetal lower extremities anomalies, not applicable or unspecified: Secondary | ICD-10-CM

## 2015-04-23 DIAGNOSIS — O283 Abnormal ultrasonic finding on antenatal screening of mother: Secondary | ICD-10-CM

## 2015-07-10 ENCOUNTER — Inpatient Hospital Stay (HOSPITAL_COMMUNITY)
Admission: AD | Admit: 2015-07-10 | Discharge: 2015-07-11 | Disposition: A | Discharge status: Home / Self Care | Payer: BLUE CROSS/BLUE SHIELD | Source: Ambulatory Visit | Attending: Obstetrics | Admitting: Obstetrics

## 2015-07-10 ENCOUNTER — Encounter (HOSPITAL_COMMUNITY): Payer: Self-pay | Admitting: *Deleted

## 2015-07-10 DIAGNOSIS — O163 Unspecified maternal hypertension, third trimester: Secondary | ICD-10-CM

## 2015-07-10 DIAGNOSIS — Z3A38 38 weeks gestation of pregnancy: Secondary | ICD-10-CM | POA: Insufficient documentation

## 2015-07-10 DIAGNOSIS — R030 Elevated blood-pressure reading, without diagnosis of hypertension: Secondary | ICD-10-CM | POA: Diagnosis not present

## 2015-07-10 DIAGNOSIS — O35HXX0 Maternal care for other (suspected) fetal abnormality and damage, fetal lower extremities anomalies, not applicable or unspecified: Secondary | ICD-10-CM

## 2015-07-10 DIAGNOSIS — O358XX0 Maternal care for other (suspected) fetal abnormality and damage, not applicable or unspecified: Secondary | ICD-10-CM

## 2015-07-10 DIAGNOSIS — O1203 Gestational edema, third trimester: Secondary | ICD-10-CM | POA: Diagnosis present

## 2015-07-10 LAB — COMPREHENSIVE METABOLIC PANEL
ALT: 12 U/L — ABNORMAL LOW (ref 14–54)
AST: 24 U/L (ref 15–41)
Albumin: 2.9 g/dL — ABNORMAL LOW (ref 3.5–5.0)
Alkaline Phosphatase: 129 U/L — ABNORMAL HIGH (ref 38–126)
Anion gap: 6 (ref 5–15)
BILIRUBIN TOTAL: 0.4 mg/dL (ref 0.3–1.2)
BUN: 13 mg/dL (ref 6–20)
CO2: 25 mmol/L (ref 22–32)
Calcium: 9.5 mg/dL (ref 8.9–10.3)
Chloride: 106 mmol/L (ref 101–111)
Creatinine, Ser: 0.8 mg/dL (ref 0.44–1.00)
Glucose, Bld: 110 mg/dL — ABNORMAL HIGH (ref 65–99)
POTASSIUM: 4.1 mmol/L (ref 3.5–5.1)
Sodium: 137 mmol/L (ref 135–145)
TOTAL PROTEIN: 6.9 g/dL (ref 6.5–8.1)

## 2015-07-10 LAB — URIC ACID: URIC ACID, SERUM: 5 mg/dL (ref 2.3–6.6)

## 2015-07-10 LAB — CBC
HEMATOCRIT: 32.4 % — AB (ref 36.0–46.0)
Hemoglobin: 10.6 g/dL — ABNORMAL LOW (ref 12.0–15.0)
MCH: 27.9 pg (ref 26.0–34.0)
MCHC: 32.7 g/dL (ref 30.0–36.0)
MCV: 85.3 fL (ref 78.0–100.0)
PLATELETS: 166 10*3/uL (ref 150–400)
RBC: 3.8 MIL/uL — ABNORMAL LOW (ref 3.87–5.11)
RDW: 15.7 % — AB (ref 11.5–15.5)
WBC: 8.6 10*3/uL (ref 4.0–10.5)

## 2015-07-10 LAB — PROTEIN / CREATININE RATIO, URINE
CREATININE, URINE: 81 mg/dL
PROTEIN CREATININE RATIO: 0.11 mg/mg{creat} (ref 0.00–0.15)
Total Protein, Urine: 9 mg/dL

## 2015-07-10 MED ORDER — ACETAMINOPHEN 500 MG PO TABS
1000.0000 mg | ORAL_TABLET | Freq: Once | ORAL | Status: AC
  Administered 2015-07-10: 1000 mg via ORAL
  Filled 2015-07-10: qty 2

## 2015-07-10 NOTE — MAU Provider Note (Signed)
History     CSN: [id number]  Arrival date and time: 07/10/15 2135  Orders placed in EPIC: 2212 Provider notified: 2212 & 2309 Provider on unit: 2330 Provider at bedside: 2332     Chief Complaint  Patient presents with  . Contractions   HPI  Ms. Shannon Richardson is a 35 yo G1P0 female at 38.[redacted] wks gestation by U/S, presenting tonight with complaints of swelling around her ankles that dented in for >10 mins when sister pushed on them. She states the swelling didn't improve after elevating them.  Her sister is a Presenter, broadcasting and had Mikes herself so she advised her to let her take her BP. She reports the BP her sister took at home was 140/90.  She also has c/o a "light" H/A x 2 days, but denies taking anything for the H/A. She reports "not liking to take medications". She has not had any issue with elevated BP up to this point in her pregnancy. Her prenatal care has been complicated by: Rh Negative status, echogenic bowel and LT club foot of the fetus by U/S.  Her primary OB provider at [facility] is Dr. Pamala Hurry.  Past Medical History  Diagnosis Date  . Medical history non-contributory     Past Surgical History  Procedure Laterality Date  . Melanoma excision      History reviewed. No pertinent family history.  Social History  Substance Use Topics  . Smoking status: Never Smoker   . Smokeless tobacco: None  . Alcohol Use: No    Allergies:  Allergies  Allergen Reactions  . Adhesive [Tape] Rash    Prescriptions prior to admission  Medication Sig Dispense Refill Last Dose  . acetaminophen (TYLENOL) 325 MG tablet Take 650 mg by mouth every 6 (six) hours as needed for moderate pain or headache.   Past Month at Unknown time  . citalopram (CELEXA) 10 MG tablet Take 10 mg by mouth daily.   07/09/2015 at 2100  . ferrous sulfate 325 (65 FE) MG tablet Take 325 mg by mouth at bedtime.   07/09/2015 at Unknown time  . Prenatal Vit-Fe Fumarate-FA (PRENATAL VITAMIN PO) Take 1 tablet by mouth  daily.    07/10/2015 at 0800    Review of Systems  Constitutional: Negative.   HENT: Negative.        H/A resolved after taking Tylenol in MAU  Eyes: Negative.   Respiratory: Negative.   Cardiovascular: Positive for leg swelling.  Gastrointestinal: Negative.   Genitourinary:       Mild UC's  Musculoskeletal: Negative.   Skin: Negative.   Neurological: Negative.   Endo/Heme/Allergies: Negative.   Psychiatric/Behavioral: Negative.    CEFM  FHR: 135 bpm / moderate variability / accels present / decels absent TOCO: irregular every 2-9 mins  Results for orders placed or performed during the hospital encounter of 07/10/15 (from the past 24 hour(s))  Protein / creatinine ratio, urine     Status: None   Collection Time: 07/10/15  9:43 PM  Result Value Ref Range   Creatinine, Urine 81.00 mg/dL   Total Protein, Urine 9 mg/dL   Protein Creatinine Ratio 0.11 0.00 - 0.15 mg/mg[Cre]  CBC     Status: Abnormal   Collection Time: 07/10/15 10:26 PM  Result Value Ref Range   WBC 8.6 4.0 - 10.5 K/uL   RBC 3.80 (L) 3.87 - 5.11 MIL/uL   Hemoglobin 10.6 (L) 12.0 - 15.0 g/dL   HCT 32.4 (L) 36.0 - 46.0 %   MCV 85.3  78.0 - 100.0 fL   MCH 27.9 26.0 - 34.0 pg   MCHC 32.7 30.0 - 36.0 g/dL   RDW 15.7 (H) 11.5 - 15.5 %   Platelets 166 150 - 400 K/uL  Comprehensive metabolic panel     Status: Abnormal   Collection Time: 07/10/15 10:26 PM  Result Value Ref Range   Sodium 137 135 - 145 mmol/L   Potassium 4.1 3.5 - 5.1 mmol/L   Chloride 106 101 - 111 mmol/L   CO2 25 22 - 32 mmol/L   Glucose, Bld 110 (H) 65 - 99 mg/dL   BUN 13 6 - 20 mg/dL   Creatinine, Ser 0.80 0.44 - 1.00 mg/dL   Calcium 9.5 8.9 - 10.3 mg/dL   Total Protein 6.9 6.5 - 8.1 g/dL   Albumin 2.9 (L) 3.5 - 5.0 g/dL   AST 24 15 - 41 U/L   ALT 12 (L) 14 - 54 U/L   Alkaline Phosphatase 129 (H) 38 - 126 U/L   Total Bilirubin 0.4 0.3 - 1.2 mg/dL   GFR calc non Af Amer >60 >60 mL/min   GFR calc Af Amer >60 >60 mL/min   Anion gap 6 5 - 15   Uric acid     Status: None   Collection Time: 07/10/15 10:26 PM  Result Value Ref Range   Uric Acid, Serum 5.0 2.3 - 6.6 mg/dL   Physical Exam   Serial BP range: 137/78 to 117/65 Blood pressure 123/73, pulse 85, temperature 98 F (36.7 C), temperature source Oral, resp. rate 18, height 5\' 7"  (1.702 m), weight 99.791 kg (220 lb), last menstrual period 10/10/2014.  Physical Exam  Constitutional: She is oriented to person, place, and time. She appears well-nourished.  HENT:  Head: Normocephalic.  Eyes: Pupils are equal, round, and reactive to light.  Neck: Normal range of motion.  Cardiovascular: Normal rate, regular rhythm, normal heart sounds and intact distal pulses.   1+ pitting edema around ankles bilaterally; trace edema in on feet bilaterally  Respiratory: Effort normal and breath sounds normal.  GI: Soft. Bowel sounds are normal.  Genitourinary:  RN performed VE earlier: FT/thick/posterior/firm; not repeated d/t irreg UC's  Musculoskeletal: Normal range of motion.  Neurological: She is alert and oriented to person, place, and time. She has normal reflexes.  No clonus  Skin: Skin is warm and dry.  Psychiatric: She has a normal mood and affect. Her behavior is normal. Judgment and thought content normal.    MAU Course  Procedures CEFM SVE Serial BPs PIH Labs Protein/Creatinine Ratio Tylenol 1000 mg po  Assessment and Plan  35 yo G1P0 at 38.[redacted] wks gestation Swelling of Lower Extermities in third trimester pregnancy Elevated Blood Pressure - WNL & stable Category 1 FHR tracing  Discharge home [facility] Precautions given Labor Precautions discussed May take Tylenol 1000 mg every 6 hours prn pain Keep scheduled appt on Friday 07/12/2015 Call the office for any further problems, questions or concerns  *Dr. Pamala Hurry notified of assessment and plan - agrees  Graceann Congress MSN, CNM 07/10/2015, 11:38 PM

## 2015-07-10 NOTE — MAU Note (Signed)
Pt presents complaining of increased swelling and HA x2 days. Has not tried pain medication. No problems with BP in office. Also having irregular contractions

## 2015-07-10 NOTE — Discharge Instructions (Signed)
Hypertension During Pregnancy  Hypertension, or high blood pressure, is when there is extra pressure inside your blood vessels that carry blood from the heart to the rest of your body (arteries). It can happen at any time in life, including pregnancy. Hypertension during pregnancy can cause problems for you and your baby. Your baby might not weigh as much as he or she should at birth or might be born early (premature). Very bad cases of hypertension during pregnancy can be life-threatening.   Different types of hypertension can occur during pregnancy. These include:  · Chronic hypertension. This happens when a woman has hypertension before pregnancy and it continues during pregnancy.  · Gestational hypertension. This is when hypertension develops during pregnancy.  · Preeclampsia or toxemia of pregnancy. This is a very serious type of hypertension that develops only during pregnancy. It affects the whole body and can be very dangerous for both mother and baby.    Gestational hypertension and preeclampsia usually go away after your baby is born. Your blood pressure will likely stabilize within 6 weeks. Women who have hypertension during pregnancy have a greater chance of developing hypertension later in life or with future pregnancies.  RISK FACTORS  There are certain factors that make it more likely for you to develop hypertension during pregnancy. These include:  · Having hypertension before pregnancy.  · Having hypertension during a previous pregnancy.  · Being overweight.  · Being older than 40 years.  · Being pregnant with more than one baby.  · Having diabetes or kidney problems.  SIGNS AND SYMPTOMS  Chronic and gestational hypertension rarely cause symptoms. Preeclampsia has symptoms, which may include:  · Increased protein in your urine. Your health care provider will check for this at every prenatal visit.  · Swelling of your hands and face.  · Rapid weight gain.  · Headaches.  · Visual changes.  · Being  bothered by light.  · Abdominal pain, especially in the upper right area.  · Chest pain.  · Shortness of breath.  · Increased reflexes.  · Seizures. These occur with a more severe form of preeclampsia, called eclampsia.  DIAGNOSIS   You may be diagnosed with hypertension during a regular prenatal exam. At each prenatal visit, you may have:  · Your blood pressure checked.  · A urine test to check for protein in your urine.  The type of hypertension you are diagnosed with depends on when you developed it. It also depends on your specific blood pressure reading.  · Developing hypertension before 20 weeks of pregnancy is consistent with chronic hypertension.  · Developing hypertension after 20 weeks of pregnancy is consistent with gestational hypertension.  · Hypertension with increased urinary protein is diagnosed as preeclampsia.  · Blood pressure measurements that stay above 160 systolic or 110 diastolic are a sign of severe preeclampsia.  TREATMENT  Treatment for hypertension during pregnancy varies. Treatment depends on the type of hypertension and how serious it is.  · If you take medicine for chronic hypertension, you may need to switch medicines.    Medicines called ACE inhibitors should not be taken during pregnancy.    Low-dose aspirin may be suggested for women who have risk factors for preeclampsia.  · If you have gestational hypertension, you may need to take a blood pressure medicine that is safe during pregnancy. Your health care provider will recommend the correct medicine.  · If you have severe preeclampsia, you may need to be in the hospital. Health care   done to protect you and your baby. The only cure for preeclampsia is delivery.  Your health  care provider may recommend that you take one low-dose aspirin (81 mg) each day to help prevent high blood pressure during your pregnancy if you are at risk for preeclampsia. You may be at risk for preeclampsia if:  You had preeclampsia or eclampsia during a previous pregnancy.  Your baby did not grow as expected during a previous pregnancy.  You experienced preterm birth with a previous pregnancy.  You experienced a separation of the placenta from the uterus (placental abruption) during a previous pregnancy.  You experienced the loss of your baby during a previous pregnancy.  You are pregnant with more than one baby.  You have other medical conditions, such as diabetes or an autoimmune disease. HOME CARE INSTRUCTIONS  Schedule and keep all of your regular prenatal care appointments. This is important.  Take medicines only as directed by your health care provider. Tell your health care provider about all medicines you take.  Eat as little salt as possible.  Get regular exercise.  Do not drink alcohol.  Do not use tobacco products.  Do not drink products with caffeine.  Lie on your left side when resting. SEEK IMMEDIATE MEDICAL CARE IF:  You have severe abdominal pain.  You have sudden swelling in your hands, ankles, or face.  You gain 4 pounds (1.8 kg) or more in 1 week.  You vomit repeatedly.  You have vaginal bleeding.  You do not feel your baby moving as much.  You have a headache.  You have blurred or double vision.  You have muscle twitching or spasms.  You have shortness of breath.  You have blue fingernails or lips.  You have blood in your urine. MAKE SURE YOU:  Understand these instructions.  Will watch your condition.  Will get help right away if you are not doing well or get worse.   This information is not intended to replace advice given to you by your health care provider. Make sure you discuss any questions you have with your health care  provider.   Document Released: 01/13/2011 Document Revised: 05/18/2014 Document Reviewed: 11/24/2012 Elsevier Interactive Patient Education 2016 [employer]  Preeclampsia and Eclampsia - FOR INFORMATION ONLY Preeclampsia is a serious condition that develops only during pregnancy. It is also called toxemia of pregnancy. This condition causes high blood pressure along with other symptoms, such as swelling and headaches. These may develop as the condition gets worse. Preeclampsia may occur 20 weeks or later into your pregnancy.  Diagnosing and treating preeclampsia early is very important. If not treated early, it can cause serious problems for you and your baby. One problem it can lead to is eclampsia, which is a condition that causes muscle jerking or shaking (convulsions) in the mother. Delivering your baby is the best treatment for preeclampsia or eclampsia.  RISK FACTORS The cause of preeclampsia is not known. You may be more likely to develop preeclampsia if you have certain risk factors. These include:   Being pregnant for the first time.  Having preeclampsia in a past pregnancy.  Having a family history of preeclampsia.  Having high blood pressure.  Being pregnant with twins or triplets.  Being 74 or older.  Being African American.  Having kidney disease or diabetes.  Having medical conditions such as lupus or blood diseases.  Being very overweight (obese). SIGNS AND SYMPTOMS  The earliest signs of preeclampsia are:  High blood pressure.  Increased protein in your urine. Your health care provider will check for this at every prenatal visit. Other symptoms that can develop include:   Severe headaches.  Sudden weight gain.  Swelling of your hands, face, legs, and feet.  Feeling sick to your stomach (nauseous) and throwing up (vomiting).  Vision problems (blurred or double vision).  Numbness in your face, arms, legs, and feet.  Dizziness.  Slurred  speech.  Sensitivity to bright lights.  Abdominal pain. DIAGNOSIS  There are no screening tests for preeclampsia. Your health care provider will ask you about symptoms and check for signs of preeclampsia during your prenatal visits. You may also have tests, including:  Urine testing.  Blood testing.  Checking your baby's heart rate.  Checking the health of your baby and your placenta using images created with sound waves (ultrasound). TREATMENT  You can work out the best treatment approach together with your health care provider. It is very important to keep all prenatal appointments. If you have an increased risk of preeclampsia, you may need more frequent prenatal exams.  Your health care provider may prescribe bed rest.  You may have to eat as little salt as possible.  You may need to take medicine to lower your blood pressure if the condition does not respond to more conservative measures.  You may need to stay in the hospital if your condition is severe. There, treatment will focus on controlling your blood pressure and fluid retention. You may also need to take medicine to prevent seizures.  If the condition gets worse, your baby may need to be delivered early to protect you and the baby. You may have your labor started with medicine (be induced), or you may have a cesarean delivery.  Preeclampsia usually goes away after the baby is born. HOME CARE INSTRUCTIONS   Only take over-the-counter or prescription medicines as directed by your health care provider.  Lie on your left side while resting. This keeps pressure off your baby.  Elevate your feet while resting.  Get regular exercise. Ask your health care provider what type of exercise is safe for you.  Avoid caffeine and alcohol.  Do not smoke.  Drink 6-8 glasses of water every day.  Eat a balanced diet that is low in salt. Do not add salt to your food.  Avoid stressful situations as much as possible.  Get  plenty of rest and sleep.  Keep all prenatal appointments and tests as scheduled. SEEK MEDICAL CARE IF:  You are gaining more weight than expected.  You have any headaches, abdominal pain, or nausea.  You are bruising more than usual.  You feel dizzy or light-headed. SEEK IMMEDIATE MEDICAL CARE IF:   You develop sudden or severe swelling anywhere in your body. This usually happens in the legs.  You gain 5 lb (2.3 kg) or more in a week.  You have a severe headache, dizziness, problems with your vision, or confusion.  You have severe abdominal pain.  You have lasting nausea or vomiting.  You have a seizure.  You have trouble moving any part of your body.  You develop numbness in your body.  You have trouble speaking.  You have any abnormal bleeding.  You develop a stiff neck.  You pass out. MAKE SURE YOU:   Understand these instructions.  Will watch your condition.  Will get help right away if you are not doing well or get worse.   This information is not intended to replace advice given  to you by your health care provider. Make sure you discuss any questions you have with your health care provider.   Document Released: 04/24/2000 Document Revised: 05/02/2013 Document Reviewed: 02/17/2013 Elsevier Interactive Patient Education [employer].

## 2015-07-12 ENCOUNTER — Other Ambulatory Visit: Payer: Self-pay | Admitting: Obstetrics & Gynecology

## 2015-07-12 ENCOUNTER — Encounter (HOSPITAL_COMMUNITY): Payer: Self-pay | Admitting: *Deleted

## 2015-07-12 ENCOUNTER — Telehealth (HOSPITAL_COMMUNITY): Payer: Self-pay | Admitting: *Deleted

## 2015-07-12 LAB — OB RESULTS CONSOLE GBS: GBS: POSITIVE

## 2015-07-12 NOTE — Telephone Encounter (Signed)
Preadmission screen  

## 2015-07-18 ENCOUNTER — Encounter (HOSPITAL_COMMUNITY): Payer: Self-pay | Admitting: *Deleted

## 2015-07-18 ENCOUNTER — Encounter (HOSPITAL_COMMUNITY): Payer: Self-pay

## 2015-07-18 ENCOUNTER — Inpatient Hospital Stay (HOSPITAL_COMMUNITY)
Admission: AD | Admit: 2015-07-18 | Discharge: 2015-07-21 | DRG: 765 | Disposition: A | Discharge status: Home / Self Care | Payer: BLUE CROSS/BLUE SHIELD | Source: Ambulatory Visit | Attending: Obstetrics & Gynecology | Admitting: Obstetrics & Gynecology

## 2015-07-18 ENCOUNTER — Inpatient Hospital Stay (HOSPITAL_COMMUNITY)
Admission: AD | Admit: 2015-07-18 | Discharge: 2015-07-18 | Disposition: A | Discharge status: Home / Self Care | Payer: BLUE CROSS/BLUE SHIELD | Source: Ambulatory Visit | Attending: Obstetrics and Gynecology | Admitting: Obstetrics and Gynecology

## 2015-07-18 DIAGNOSIS — Z85820 Personal history of malignant melanoma of skin: Secondary | ICD-10-CM

## 2015-07-18 DIAGNOSIS — O35HXX0 Maternal care for other (suspected) fetal abnormality and damage, fetal lower extremities anomalies, not applicable or unspecified: Secondary | ICD-10-CM

## 2015-07-18 DIAGNOSIS — Z8249 Family history of ischemic heart disease and other diseases of the circulatory system: Secondary | ICD-10-CM

## 2015-07-18 DIAGNOSIS — O358XX0 Maternal care for other (suspected) fetal abnormality and damage, not applicable or unspecified: Secondary | ICD-10-CM | POA: Diagnosis present

## 2015-07-18 DIAGNOSIS — D509 Iron deficiency anemia, unspecified: Secondary | ICD-10-CM | POA: Diagnosis present

## 2015-07-18 DIAGNOSIS — O99824 Streptococcus B carrier state complicating childbirth: Secondary | ICD-10-CM | POA: Diagnosis present

## 2015-07-18 DIAGNOSIS — O3660X0 Maternal care for excessive fetal growth, unspecified trimester, not applicable or unspecified: Secondary | ICD-10-CM | POA: Diagnosis present

## 2015-07-18 DIAGNOSIS — Z9889 Other specified postprocedural states: Secondary | ICD-10-CM

## 2015-07-18 DIAGNOSIS — O9902 Anemia complicating childbirth: Secondary | ICD-10-CM | POA: Diagnosis present

## 2015-07-18 DIAGNOSIS — O3663X0 Maternal care for excessive fetal growth, third trimester, not applicable or unspecified: Secondary | ICD-10-CM | POA: Diagnosis present

## 2015-07-18 DIAGNOSIS — D62 Acute posthemorrhagic anemia: Secondary | ICD-10-CM | POA: Diagnosis present

## 2015-07-18 LAB — CBC
HEMATOCRIT: 32.2 % — AB (ref 36.0–46.0)
HEMOGLOBIN: 10.8 g/dL — AB (ref 12.0–15.0)
MCH: 28.5 pg (ref 26.0–34.0)
MCHC: 33.5 g/dL (ref 30.0–36.0)
MCV: 85 fL (ref 78.0–100.0)
Platelets: 178 10*3/uL (ref 150–400)
RBC: 3.79 MIL/uL — ABNORMAL LOW (ref 3.87–5.11)
RDW: 15.4 % (ref 11.5–15.5)
WBC: 12.1 10*3/uL — ABNORMAL HIGH (ref 4.0–10.5)

## 2015-07-18 LAB — POCT FERN TEST: POCT FERN TEST: NEGATIVE

## 2015-07-18 LAB — TYPE AND SCREEN
ABO/RH(D): A NEG
Antibody Screen: NEGATIVE

## 2015-07-18 MED ORDER — ACETAMINOPHEN 325 MG PO TABS: 650.0000 mg | ORAL_TABLET | ORAL | Status: DC | PRN

## 2015-07-18 MED ORDER — OXYCODONE-ACETAMINOPHEN 5-325 MG PO TABS: 2.0000 | ORAL_TABLET | ORAL | Status: DC | PRN

## 2015-07-18 MED ORDER — PENICILLIN G POTASSIUM 5000000 UNITS IJ SOLR
5.0000 10*6.[IU] | Freq: Once | INTRAVENOUS | Status: AC
  Administered 2015-07-18: 5 10*6.[IU] via INTRAVENOUS
  Filled 2015-07-18: qty 5

## 2015-07-18 MED ORDER — LIDOCAINE HCL (PF) 1 % IJ SOLN
30.0000 mL | INTRAMUSCULAR | Status: DC | PRN
  Filled 2015-07-18: qty 30

## 2015-07-18 MED ORDER — LACTATED RINGERS IV SOLN
INTRAVENOUS | Status: DC
  Administered 2015-07-18 – 2015-07-19 (×5): via INTRAVENOUS
  Administered 2015-07-19: 125 mL/h via INTRAVENOUS
  Administered 2015-07-19: 17:00:00 via INTRAVENOUS
  Administered 2015-07-19: 125 mL/h via INTRAVENOUS

## 2015-07-18 MED ORDER — CITRIC ACID-SODIUM CITRATE 334-500 MG/5ML PO SOLN
30.0000 mL | ORAL | Status: DC | PRN
  Administered 2015-07-19: 30 mL via ORAL
  Filled 2015-07-18: qty 15

## 2015-07-18 MED ORDER — OXYCODONE-ACETAMINOPHEN 5-325 MG PO TABS: 1.0000 | ORAL_TABLET | ORAL | Status: DC | PRN

## 2015-07-18 MED ORDER — FLEET ENEMA 7-19 GM/118ML RE ENEM: 1.0000 | ENEMA | RECTAL | Status: DC | PRN

## 2015-07-18 MED ORDER — PENICILLIN G POTASSIUM 5000000 UNITS IJ SOLR
2.5000 10*6.[IU] | INTRAVENOUS | Status: DC
  Administered 2015-07-19 (×3): 2.5 10*6.[IU] via INTRAVENOUS
  Filled 2015-07-18 (×6): qty 2.5

## 2015-07-18 MED ORDER — OXYTOCIN BOLUS FROM INFUSION: 500.0000 mL | INTRAVENOUS | Status: DC

## 2015-07-18 MED ORDER — OXYTOCIN 10 UNIT/ML IJ SOLN
2.5000 [IU]/h | INTRAVENOUS | Status: DC
  Filled 2015-07-18: qty 10

## 2015-07-18 MED ORDER — ONDANSETRON HCL 4 MG/2ML IJ SOLN: 4.0000 mg | Freq: Four times a day (QID) | INTRAMUSCULAR | Status: DC | PRN

## 2015-07-18 MED ORDER — BUTORPHANOL TARTRATE 2 MG/ML IJ SOLN
2.0000 mg | INTRAMUSCULAR | Status: DC | PRN
  Administered 2015-07-18: 2 mg via INTRAVENOUS
  Filled 2015-07-18: qty 2

## 2015-07-18 MED ORDER — LACTATED RINGERS IV SOLN: 500.0000 mL | INTRAVENOUS | Status: DC | PRN

## 2015-07-18 NOTE — MAU Note (Signed)
Patient was told she was being discharged. I reassured her and she was fine with Taavon MD decision. Her sister then gets upset and wants to see MD in person.  I told her that I would get the charge nurse. Danielle S. Came to talk to patient did more reassuring and made sure the PATIENT not the sister was okay or if she had any questions. Patient told us she was okay, would try some of the suggestions that Andee Poles gave her, and she was discharged. Patient is scheduled for induction at 7 am.

## 2015-07-18 NOTE — MAU Note (Signed)
Pt presents for worsening contractions, denies bleeding or ROM

## 2015-07-18 NOTE — MAU Note (Signed)
Patient states 8 pm contractions started then at 2am she felt like she was leaking fluid. No bleeding

## 2015-07-18 NOTE — Discharge Instructions (Signed)
Fetal Movement Counts °Patient Name: __________________________________________________ Patient Due Date: ____________________ °Performing a fetal movement count is highly recommended in high-risk pregnancies, but it is good for every pregnant woman to do. Your health care provider may ask you to start counting fetal movements at 28 weeks of the pregnancy. Fetal movements often increase: °· After eating a full meal. °· After physical activity. °· After eating or drinking something sweet or cold. °· At rest. °Pay attention to when you feel the baby is most active. This will help you notice a pattern of your baby's sleep and wake cycles and what factors contribute to an increase in fetal movement. It is important to perform a fetal movement count at the same time each day when your baby is normally most active.  °HOW TO COUNT FETAL MOVEMENTS °1. Find a quiet and comfortable area to sit or lie down on your left side. Lying on your left side provides the best blood and oxygen circulation to your baby. °2. Write down the day and time on a sheet of paper or in a journal. °3. Start counting kicks, flutters, swishes, rolls, or jabs in a 2-hour period. You should feel at least 10 movements within 2 hours. °4. If you do not feel 10 movements in 2 hours, wait 2-3 hours and count again. Look for a change in the pattern or not enough counts in 2 hours. °SEEK MEDICAL CARE IF: °· You feel less than 10 counts in 2 hours, tried twice. °· There is no movement in over an hour. °· The pattern is changing or taking longer each day to reach 10 counts in 2 hours. °· You feel the baby is not moving as he or she usually does. °Date: ____________ Movements: ____________ Start time: ____________ Finish time: ____________  °Date: ____________ Movements: ____________ Start time: ____________ Finish time: ____________ °Date: ____________ Movements: ____________ Start time: ____________ Finish time: ____________ °Date: ____________ Movements:  ____________ Start time: ____________ Finish time: ____________ °Date: ____________ Movements: ____________ Start time: ____________ Finish time: ____________ °Date: ____________ Movements: ____________ Start time: ____________ Finish time: ____________ °Date: ____________ Movements: ____________ Start time: ____________ Finish time: ____________ °Date: ____________ Movements: ____________ Start time: ____________ Finish time: ____________  °Date: ____________ Movements: ____________ Start time: ____________ Finish time: ____________ °Date: ____________ Movements: ____________ Start time: ____________ Finish time: ____________ °Date: ____________ Movements: ____________ Start time: ____________ Finish time: ____________ °Date: ____________ Movements: ____________ Start time: ____________ Finish time: ____________ °Date: ____________ Movements: ____________ Start time: ____________ Finish time: ____________ °Date: ____________ Movements: ____________ Start time: ____________ Finish time: ____________ °Date: ____________ Movements: ____________ Start time: ____________ Finish time: ____________  °Date: ____________ Movements: ____________ Start time: ____________ Finish time: ____________ °Date: ____________ Movements: ____________ Start time: ____________ Finish time: ____________ °Date: ____________ Movements: ____________ Start time: ____________ Finish time: ____________ °Date: ____________ Movements: ____________ Start time: ____________ Finish time: ____________ °Date: ____________ Movements: ____________ Start time: ____________ Finish time: ____________ °Date: ____________ Movements: ____________ Start time: ____________ Finish time: ____________ °Date: ____________ Movements: ____________ Start time: ____________ Finish time: ____________  °Date: ____________ Movements: ____________ Start time: ____________ Finish time: ____________ °Date: ____________ Movements: ____________ Start time: ____________ Finish  time: ____________ °Date: ____________ Movements: ____________ Start time: ____________ Finish time: ____________ °Date: ____________ Movements: ____________ Start time: ____________ Finish time: ____________ °Date: ____________ Movements: ____________ Start time: ____________ Finish time: ____________ °Date: ____________ Movements: ____________ Start time: ____________ Finish time: ____________ °Date: ____________ Movements: ____________ Start time: ____________ Finish time: ____________  °Date: ____________ Movements: ____________ Start time: ____________ Finish   time: ____________ Date: ____________ Movements: ____________ Start time: ____________ Shannon Richardson time: ____________ Date: ____________ Movements: ____________ Start time: ____________ Shannon Richardson time: ____________ Date: ____________ Movements: ____________ Start time: ____________ Shannon Richardson time: ____________ Date: ____________ Movements: ____________ Start time: ____________ Shannon Richardson time: ____________ Date: ____________ Movements: ____________ Start time: ____________ Shannon Richardson time: ____________ Date: ____________ Movements: ____________ Start time: ____________ Shannon Richardson time: ____________  Date: ____________ Movements: ____________ Start time: ____________ Shannon Richardson time: ____________ Date: ____________ Movements: ____________ Start time: ____________ Shannon Richardson time: ____________ Date: ____________ Movements: ____________ Start time: ____________ Shannon Richardson time: ____________ Date: ____________ Movements: ____________ Start time: ____________ Shannon Richardson time: ____________ Date: ____________ Movements: ____________ Start time: ____________ Shannon Richardson time: ____________ Date: ____________ Movements: ____________ Start time: ____________ Shannon Richardson time: ____________ Date: ____________ Movements: ____________ Start time: ____________ Shannon Richardson time: ____________  Date: ____________ Movements: ____________ Start time: ____________ Shannon Richardson time: ____________ Date: ____________  Movements: ____________ Start time: ____________ Shannon Richardson time: ____________ Date: ____________ Movements: ____________ Start time: ____________ Shannon Richardson time: ____________ Date: ____________ Movements: ____________ Start time: ____________ Shannon Richardson time: ____________ Date: ____________ Movements: ____________ Start time: ____________ Shannon Richardson time: ____________ Date: ____________ Movements: ____________ Start time: ____________ Shannon Richardson time: ____________ Date: ____________ Movements: ____________ Start time: ____________ Shannon Richardson time: ____________  Date: ____________ Movements: ____________ Start time: ____________ Shannon Richardson time: ____________ Date: ____________ Movements: ____________ Start time: ____________ Shannon Richardson time: ____________ Date: ____________ Movements: ____________ Start time: ____________ Shannon Richardson time: ____________ Date: ____________ Movements: ____________ Start time: ____________ Shannon Richardson time: ____________ Date: ____________ Movements: ____________ Start time: ____________ Shannon Richardson time: ____________ Date: ____________ Movements: ____________ Start time: ____________ Shannon Richardson time: ____________   This information is not intended to replace advice given to you by your health care provider. Make sure you discuss any questions you have with your health care provider.   Document Released: 05/27/2006 Document Revised: 05/18/2014 Document Reviewed: 02/22/2012 Elsevier Interactive Patient Education 2016 [place] of Pregnancy The third trimester is from week 29 through week 42, months 7 through 9. The third trimester is a time when the fetus is growing rapidly. At the end of the ninth month, the fetus is about 20 inches in length and weighs 6-10 pounds.  BODY CHANGES Your body goes through many changes during pregnancy. The changes vary from woman to woman.   Your weight will continue to increase. You can expect to gain 25-35 pounds (11-16 kg) by the end of the pregnancy.  You may  begin to get stretch marks on your hips, abdomen, and breasts.  You may urinate more often because the fetus is moving lower into your pelvis and pressing on your bladder.  You may develop or continue to have heartburn as a result of your pregnancy.  You may develop constipation because certain hormones are causing the muscles that push waste through your intestines to slow down.  You may develop hemorrhoids or swollen, bulging veins (varicose veins).  You may have pelvic pain because of the weight gain and pregnancy hormones relaxing your joints between the bones in your pelvis. Backaches may result from overexertion of the muscles supporting your posture.  You may have changes in your hair. These can include thickening of your hair, rapid growth, and changes in texture. Some women also have hair loss during or after pregnancy, or hair that feels dry or thin. Your hair will most likely return to normal after your baby is born.  Your breasts will continue to grow and be tender. A yellow discharge may leak from your breasts called colostrum.  Your belly button may stick out.  You may feel short of breath because of your expanding uterus.  You may notice the fetus "dropping," or moving lower in your abdomen.  You may have a bloody mucus discharge. This usually occurs a few days to a week before labor begins.  Your cervix becomes thin and soft (effaced) near your due date. WHAT TO EXPECT AT YOUR PRENATAL EXAMS  You will have prenatal exams every 2 weeks until week 36. Then, you will have weekly prenatal exams. During a routine prenatal visit: 5. You will be weighed to make sure you and the fetus are growing normally. 6. Your blood pressure is taken. 7. Your abdomen will be measured to track your baby's growth. 8. The fetal heartbeat will be listened to. 9. Any test results from the previous visit will be discussed. 10. You may have a cervical check near your due date to see if you have  effaced. At around 36 weeks, your caregiver will check your cervix. At the same time, your caregiver will also perform a test on the secretions of the vaginal tissue. This test is to determine if a type of bacteria, Group B streptococcus, is present. Your caregiver will explain this further. Your caregiver may ask you:  What your birth plan is.  How you are feeling.  If you are feeling the baby move.  If you have had any abnormal symptoms, such as leaking fluid, bleeding, severe headaches, or abdominal cramping.  If you are using any tobacco products, including cigarettes, chewing tobacco, and electronic cigarettes.  If you have any questions. Other tests or screenings that may be performed during your third trimester include:  Blood tests that check for low iron levels (anemia).  Fetal testing to check the health, activity level, and growth of the fetus. Testing is done if you have certain medical conditions or if there are problems during the pregnancy.  HIV (human immunodeficiency virus) testing. If you are at high risk, you may be screened for HIV during your third trimester of pregnancy. FALSE LABOR You may feel small, irregular contractions that eventually go away. These are called Braxton Hicks contractions, or false labor. Contractions may last for hours, days, or even weeks before true labor sets in. If contractions come at regular intervals, intensify, or become painful, it is best to be seen by your caregiver.  SIGNS OF LABOR   Menstrual-like cramps.  Contractions that are 5 minutes apart or less.  Contractions that start on the top of the uterus and spread down to the lower abdomen and back.  A sense of increased pelvic pressure or back pain.  A watery or bloody mucus discharge that comes from the vagina. If you have any of these signs before the 37th week of pregnancy, call your caregiver right away. You need to go to the hospital to get checked immediately. HOME CARE  INSTRUCTIONS   Avoid all smoking, herbs, alcohol, and unprescribed drugs. These chemicals affect the formation and growth of the baby.  Do not use any tobacco products, including cigarettes, chewing tobacco, and electronic cigarettes. If you need help quitting, ask your health care provider. You may receive counseling support and other resources to help you quit.  Follow your caregiver's instructions regarding medicine use. There are medicines that are either safe or unsafe to take during pregnancy.  Exercise only as directed by your caregiver. Experiencing uterine cramps is a good sign to stop exercising.  Continue to eat regular, healthy meals.  Wear a good support bra for  breast tenderness.  Do not use hot tubs, steam rooms, or saunas.  Wear your seat belt at all times when driving.  Avoid raw meat, uncooked cheese, cat litter boxes, and soil used by cats. These carry germs that can cause birth defects in the baby.  Take your prenatal vitamins.  Take 1500-2000 mg of calcium daily starting at the 20th week of pregnancy until you deliver your baby.  Try taking a stool softener (if your caregiver approves) if you develop constipation. Eat more high-fiber foods, such as fresh vegetables or fruit and whole grains. Drink plenty of fluids to keep your urine clear or pale yellow.  Take warm sitz baths to soothe any pain or discomfort caused by hemorrhoids. Use hemorrhoid cream if your caregiver approves.  If you develop varicose veins, wear support hose. Elevate your feet for 15 minutes, 3-4 times a day. Limit salt in your diet.  Avoid heavy lifting, wear low heal shoes, and practice good posture.  Rest a lot with your legs elevated if you have leg cramps or low back pain.  Visit your dentist if you have not gone during your pregnancy. Use a soft toothbrush to brush your teeth and be gentle when you floss.  A sexual relationship may be continued unless your caregiver directs you  otherwise.  Do not travel far distances unless it is absolutely necessary and only with the approval of your caregiver.  Take prenatal classes to understand, practice, and ask questions about the labor and delivery.  Make a trial run to the hospital.  Pack your hospital bag.  Prepare the baby's nursery.  Continue to go to all your prenatal visits as directed by your caregiver. SEEK MEDICAL CARE IF:  You are unsure if you are in labor or if your water has broken.  You have dizziness.  You have mild pelvic cramps, pelvic pressure, or nagging pain in your abdominal area.  You have persistent nausea, vomiting, or diarrhea.  You have a bad smelling vaginal discharge.  You have pain with urination. SEEK IMMEDIATE MEDICAL CARE IF:   You have a fever.  You are leaking fluid from your vagina.  You have spotting or bleeding from your vagina.  You have severe abdominal cramping or pain.  You have rapid weight loss or gain.  You have shortness of breath with chest pain.  You notice sudden or extreme swelling of your face, hands, ankles, feet, or legs.  You have not felt your baby move in over an hour.  You have severe headaches that do not go away with medicine.  You have vision changes.   This information is not intended to replace advice given to you by your health care provider. Make sure you discuss any questions you have with your health care provider.   Document Released: 04/21/2001 Document Revised: 05/18/2014 Document Reviewed: 06/28/2012 Elsevier Interactive Patient Education [employer].

## 2015-07-18 NOTE — MAU Provider Note (Signed)
Presents for evaluation of SROM Evaluation negative Category 1 tracing with irregular contractions. Dc home with labor precautions

## 2015-07-19 ENCOUNTER — Inpatient Hospital Stay (HOSPITAL_COMMUNITY): Admission: RE | Admit: 2015-07-19 | Payer: BLUE CROSS/BLUE SHIELD | Source: Ambulatory Visit

## 2015-07-19 ENCOUNTER — Inpatient Hospital Stay (HOSPITAL_COMMUNITY): Payer: BLUE CROSS/BLUE SHIELD | Admitting: Anesthesiology

## 2015-07-19 ENCOUNTER — Encounter (HOSPITAL_COMMUNITY): Payer: Self-pay | Admitting: Anesthesiology

## 2015-07-19 ENCOUNTER — Encounter (HOSPITAL_COMMUNITY)
Admission: AD | Disposition: A | Discharge status: Home / Self Care | Payer: Self-pay | Source: Ambulatory Visit | Attending: Obstetrics & Gynecology

## 2015-07-19 DIAGNOSIS — Z9889 Other specified postprocedural states: Secondary | ICD-10-CM

## 2015-07-19 HISTORY — DX: Other specified postprocedural states: Z98.890

## 2015-07-19 LAB — CBC
HCT: 27.4 % — ABNORMAL LOW (ref 36.0–46.0)
Hemoglobin: 9.1 g/dL — ABNORMAL LOW (ref 12.0–15.0)
MCH: 28.6 pg (ref 26.0–34.0)
MCHC: 33.2 g/dL (ref 30.0–36.0)
MCV: 86.2 fL (ref 78.0–100.0)
PLATELETS: 143 10*3/uL — AB (ref 150–400)
RBC: 3.18 MIL/uL — ABNORMAL LOW (ref 3.87–5.11)
RDW: 15.4 % (ref 11.5–15.5)
WBC: 14.2 10*3/uL — ABNORMAL HIGH (ref 4.0–10.5)

## 2015-07-19 LAB — ABO/RH: ABO/RH(D): A NEG

## 2015-07-19 LAB — RPR: RPR Ser Ql: NONREACTIVE

## 2015-07-19 SURGERY — Surgical Case
Anesthesia: Epidural

## 2015-07-19 MED ORDER — DIBUCAINE 1 % RE OINT: 1.0000 "application " | TOPICAL_OINTMENT | RECTAL | Status: DC | PRN

## 2015-07-19 MED ORDER — LACTATED RINGERS IV SOLN
INTRAVENOUS | Status: DC | PRN
  Administered 2015-07-19: 18:00:00 via INTRAVENOUS

## 2015-07-19 MED ORDER — DEXAMETHASONE SODIUM PHOSPHATE 4 MG/ML IJ SOLN
INTRAMUSCULAR | Status: DC | PRN
  Administered 2015-07-19: 4 mg via INTRAVENOUS

## 2015-07-19 MED ORDER — LIDOCAINE-EPINEPHRINE (PF) 2 %-1:200000 IJ SOLN
INTRAMUSCULAR | Status: AC
  Filled 2015-07-19: qty 20

## 2015-07-19 MED ORDER — SIMETHICONE 80 MG PO CHEW
80.0000 mg | CHEWABLE_TABLET | ORAL | Status: DC
  Administered 2015-07-20: 80 mg via ORAL
  Filled 2015-07-19: qty 1

## 2015-07-19 MED ORDER — SENNOSIDES-DOCUSATE SODIUM 8.6-50 MG PO TABS
2.0000 | ORAL_TABLET | ORAL | Status: DC
  Administered 2015-07-20: 2 via ORAL
  Filled 2015-07-19: qty 2

## 2015-07-19 MED ORDER — KETOROLAC TROMETHAMINE 30 MG/ML IJ SOLN: 30.0000 mg | Freq: Four times a day (QID) | INTRAMUSCULAR | Status: DC | PRN

## 2015-07-19 MED ORDER — PHENYLEPHRINE 40 MCG/ML (10ML) SYRINGE FOR IV PUSH (FOR BLOOD PRESSURE SUPPORT)
80.0000 ug | PREFILLED_SYRINGE | INTRAVENOUS | Status: DC | PRN
  Filled 2015-07-19: qty 20

## 2015-07-19 MED ORDER — CITALOPRAM HYDROBROMIDE 10 MG PO TABS
10.0000 mg | ORAL_TABLET | Freq: Every day | ORAL | Status: DC
  Administered 2015-07-20 – 2015-07-21 (×2): 10 mg via ORAL
  Filled 2015-07-19 (×3): qty 1

## 2015-07-19 MED ORDER — OXYTOCIN 10 UNIT/ML IJ SOLN: 2.5000 [IU]/h | INTRAVENOUS | Status: DC

## 2015-07-19 MED ORDER — FENTANYL 2.5 MCG/ML BUPIVACAINE 1/10 % EPIDURAL INFUSION (WH - ANES)
14.0000 mL/h | INTRAMUSCULAR | Status: DC | PRN
  Administered 2015-07-19 (×2): 14 mL/h via EPIDURAL
  Filled 2015-07-19 (×2): qty 125

## 2015-07-19 MED ORDER — NALOXONE HCL 0.4 MG/ML IJ SOLN: 0.4000 mg | INTRAMUSCULAR | Status: DC | PRN

## 2015-07-19 MED ORDER — SODIUM BICARBONATE 8.4 % IV SOLN
INTRAVENOUS | Status: DC | PRN
  Administered 2015-07-19 (×2): 5 mL via EPIDURAL

## 2015-07-19 MED ORDER — DIPHENHYDRAMINE HCL 50 MG/ML IJ SOLN: 12.5000 mg | INTRAMUSCULAR | Status: DC | PRN

## 2015-07-19 MED ORDER — SIMETHICONE 80 MG PO CHEW: 80.0000 mg | CHEWABLE_TABLET | ORAL | Status: DC | PRN

## 2015-07-19 MED ORDER — ONDANSETRON HCL 4 MG/2ML IJ SOLN
INTRAMUSCULAR | Status: DC | PRN
  Administered 2015-07-19: 4 mg via INTRAVENOUS

## 2015-07-19 MED ORDER — PRENATAL MULTIVITAMIN CH
1.0000 | ORAL_TABLET | Freq: Every day | ORAL | Status: DC
  Administered 2015-07-20 – 2015-07-21 (×2): 1 via ORAL
  Filled 2015-07-19 (×2): qty 1

## 2015-07-19 MED ORDER — IBUPROFEN 600 MG PO TABS: 600.0000 mg | ORAL_TABLET | Freq: Four times a day (QID) | ORAL | Status: DC | PRN

## 2015-07-19 MED ORDER — IBUPROFEN 600 MG PO TABS
600.0000 mg | ORAL_TABLET | Freq: Four times a day (QID) | ORAL | Status: DC
  Administered 2015-07-20 – 2015-07-21 (×6): 600 mg via ORAL
  Filled 2015-07-19 (×6): qty 1

## 2015-07-19 MED ORDER — OXYCODONE-ACETAMINOPHEN 5-325 MG PO TABS: 2.0000 | ORAL_TABLET | ORAL | Status: DC | PRN

## 2015-07-19 MED ORDER — SODIUM BICARBONATE 8.4 % IV SOLN
INTRAVENOUS | Status: AC
  Filled 2015-07-19: qty 50

## 2015-07-19 MED ORDER — MORPHINE SULFATE (PF) 0.5 MG/ML IJ SOLN
INTRAMUSCULAR | Status: AC
  Filled 2015-07-19: qty 10

## 2015-07-19 MED ORDER — MAGNESIUM HYDROXIDE 400 MG/5ML PO SUSP: 30.0000 mL | ORAL | Status: DC | PRN

## 2015-07-19 MED ORDER — ONDANSETRON HCL 4 MG/2ML IJ SOLN
INTRAMUSCULAR | Status: AC
  Filled 2015-07-19: qty 2

## 2015-07-19 MED ORDER — OXYCODONE-ACETAMINOPHEN 5-325 MG PO TABS
1.0000 | ORAL_TABLET | ORAL | Status: DC | PRN
  Administered 2015-07-21 (×2): 1 via ORAL
  Filled 2015-07-19 (×2): qty 1

## 2015-07-19 MED ORDER — ONDANSETRON HCL 4 MG/2ML IJ SOLN
4.0000 mg | Freq: Three times a day (TID) | INTRAMUSCULAR | Status: DC | PRN
  Administered 2015-07-19: 4 mg via INTRAVENOUS
  Filled 2015-07-19: qty 2

## 2015-07-19 MED ORDER — NALBUPHINE HCL 10 MG/ML IJ SOLN: 5.0000 mg | Freq: Once | INTRAMUSCULAR | Status: DC | PRN

## 2015-07-19 MED ORDER — MEPERIDINE HCL 25 MG/ML IJ SOLN: 6.2500 mg | INTRAMUSCULAR | Status: DC | PRN

## 2015-07-19 MED ORDER — LANOLIN HYDROUS EX OINT: 1.0000 "application " | TOPICAL_OINTMENT | CUTANEOUS | Status: DC | PRN

## 2015-07-19 MED ORDER — OXYTOCIN 10 UNIT/ML IJ SOLN
1.0000 m[IU]/min | INTRAVENOUS | Status: DC
  Administered 2015-07-19: 2 m[IU]/min via INTRAVENOUS

## 2015-07-19 MED ORDER — ACETAMINOPHEN 325 MG PO TABS: 650.0000 mg | ORAL_TABLET | ORAL | Status: DC | PRN

## 2015-07-19 MED ORDER — OXYTOCIN 10 UNIT/ML IJ SOLN
40.0000 [IU] | INTRAVENOUS | Status: DC | PRN
  Administered 2015-07-19: 40 [IU] via INTRAVENOUS

## 2015-07-19 MED ORDER — TETANUS-DIPHTH-ACELL PERTUSSIS 5-2.5-18.5 LF-MCG/0.5 IM SUSP
0.5000 mL | Freq: Once | INTRAMUSCULAR | Status: DC
Start: 2015-07-20 — End: 2015-07-20

## 2015-07-19 MED ORDER — DIPHENHYDRAMINE HCL 25 MG PO CAPS: 25.0000 mg | ORAL_CAPSULE | Freq: Four times a day (QID) | ORAL | Status: DC | PRN

## 2015-07-19 MED ORDER — SODIUM CHLORIDE 0.9% FLUSH: 3.0000 mL | INTRAVENOUS | Status: DC | PRN

## 2015-07-19 MED ORDER — EPHEDRINE 5 MG/ML INJ: 10.0000 mg | INTRAVENOUS | Status: DC | PRN

## 2015-07-19 MED ORDER — SCOPOLAMINE 1 MG/3DAYS TD PT72
MEDICATED_PATCH | TRANSDERMAL | Status: AC
  Filled 2015-07-19: qty 1

## 2015-07-19 MED ORDER — DEXAMETHASONE SODIUM PHOSPHATE 4 MG/ML IJ SOLN
INTRAMUSCULAR | Status: AC
  Filled 2015-07-19: qty 1

## 2015-07-19 MED ORDER — NALOXONE HCL 2 MG/2ML IJ SOSY: 1.0000 ug/kg/h | PREFILLED_SYRINGE | INTRAVENOUS | Status: DC | PRN

## 2015-07-19 MED ORDER — OXYTOCIN 10 UNIT/ML IJ SOLN
INTRAMUSCULAR | Status: AC
  Filled 2015-07-19: qty 4

## 2015-07-19 MED ORDER — NALBUPHINE HCL 10 MG/ML IJ SOLN
5.0000 mg | INTRAMUSCULAR | Status: DC | PRN
Start: 2015-07-19 — End: 2015-07-20

## 2015-07-19 MED ORDER — SCOPOLAMINE 1 MG/3DAYS TD PT72
MEDICATED_PATCH | TRANSDERMAL | Status: DC | PRN
  Administered 2015-07-19: 1 via TRANSDERMAL

## 2015-07-19 MED ORDER — WITCH HAZEL-GLYCERIN EX PADS: 1.0000 "application " | MEDICATED_PAD | CUTANEOUS | Status: DC | PRN

## 2015-07-19 MED ORDER — FENTANYL CITRATE (PF) 100 MCG/2ML IJ SOLN: 25.0000 ug | INTRAMUSCULAR | Status: DC | PRN

## 2015-07-19 MED ORDER — PHENYLEPHRINE HCL 10 MG/ML IJ SOLN
INTRAMUSCULAR | Status: DC | PRN
  Administered 2015-07-19: 120 ug via INTRAVENOUS
  Administered 2015-07-19: 80 ug via INTRAVENOUS

## 2015-07-19 MED ORDER — FERROUS SULFATE 325 (65 FE) MG PO TABS
325.0000 mg | ORAL_TABLET | Freq: Two times a day (BID) | ORAL | Status: DC
  Administered 2015-07-20: 325 mg via ORAL
  Filled 2015-07-19: qty 1

## 2015-07-19 MED ORDER — ZOLPIDEM TARTRATE 5 MG PO TABS: 5.0000 mg | ORAL_TABLET | Freq: Every evening | ORAL | Status: DC | PRN

## 2015-07-19 MED ORDER — MEPERIDINE HCL 25 MG/ML IJ SOLN
INTRAMUSCULAR | Status: AC
  Filled 2015-07-19: qty 1

## 2015-07-19 MED ORDER — LACTATED RINGERS IV SOLN
500.0000 mL | Freq: Once | INTRAVENOUS | Status: AC
  Administered 2015-07-19: 500 mL via INTRAVENOUS

## 2015-07-19 MED ORDER — PHENYLEPHRINE 40 MCG/ML (10ML) SYRINGE FOR IV PUSH (FOR BLOOD PRESSURE SUPPORT): 80.0000 ug | PREFILLED_SYRINGE | INTRAVENOUS | Status: DC | PRN

## 2015-07-19 MED ORDER — DIPHENHYDRAMINE HCL 25 MG PO CAPS
25.0000 mg | ORAL_CAPSULE | ORAL | Status: DC | PRN
  Administered 2015-07-20: 25 mg via ORAL
  Filled 2015-07-19: qty 1

## 2015-07-19 MED ORDER — TERBUTALINE SULFATE 1 MG/ML IJ SOLN: 0.2500 mg | Freq: Once | INTRAMUSCULAR | Status: DC | PRN

## 2015-07-19 MED ORDER — CEFAZOLIN SODIUM-DEXTROSE 2-3 GM-% IV SOLR
INTRAVENOUS | Status: AC
  Filled 2015-07-19: qty 50

## 2015-07-19 MED ORDER — NALBUPHINE HCL 10 MG/ML IJ SOLN: 5.0000 mg | INTRAMUSCULAR | Status: DC | PRN

## 2015-07-19 MED ORDER — MEPERIDINE HCL 25 MG/ML IJ SOLN
INTRAMUSCULAR | Status: DC | PRN
  Administered 2015-07-19: 25 mg via INTRAVENOUS

## 2015-07-19 MED ORDER — BUPIVACAINE HCL 0.25 % IJ SOLN
INTRAMUSCULAR | Status: DC | PRN
  Administered 2015-07-19: 20 mL

## 2015-07-19 MED ORDER — CEFAZOLIN SODIUM-DEXTROSE 2-3 GM-% IV SOLR
INTRAVENOUS | Status: DC | PRN
  Administered 2015-07-19: 2 g via INTRAVENOUS

## 2015-07-19 MED ORDER — SIMETHICONE 80 MG PO CHEW
80.0000 mg | CHEWABLE_TABLET | Freq: Three times a day (TID) | ORAL | Status: DC
  Administered 2015-07-20 – 2015-07-21 (×4): 80 mg via ORAL
  Filled 2015-07-19 (×4): qty 1

## 2015-07-19 MED ORDER — MENTHOL 3 MG MT LOZG: 1.0000 | LOZENGE | OROMUCOSAL | Status: DC | PRN

## 2015-07-19 MED ORDER — LACTATED RINGERS IV SOLN
INTRAVENOUS | Status: DC
  Administered 2015-07-19 – 2015-07-20 (×2): via INTRAVENOUS

## 2015-07-19 MED ORDER — SCOPOLAMINE 1 MG/3DAYS TD PT72: 1.0000 | MEDICATED_PATCH | Freq: Once | TRANSDERMAL | Status: DC

## 2015-07-19 MED ORDER — PHENYLEPHRINE 40 MCG/ML (10ML) SYRINGE FOR IV PUSH (FOR BLOOD PRESSURE SUPPORT)
PREFILLED_SYRINGE | INTRAVENOUS | Status: AC
  Filled 2015-07-19: qty 10

## 2015-07-19 MED ORDER — BUPIVACAINE HCL (PF) 0.25 % IJ SOLN
INTRAMUSCULAR | Status: AC
  Filled 2015-07-19: qty 30

## 2015-07-19 MED ORDER — MORPHINE SULFATE (PF) 0.5 MG/ML IJ SOLN
INTRAMUSCULAR | Status: DC | PRN
  Administered 2015-07-19: 4 mg via EPIDURAL

## 2015-07-19 MED ORDER — LIDOCAINE HCL (PF) 1 % IJ SOLN
INTRAMUSCULAR | Status: DC | PRN
  Administered 2015-07-19: 6 mL via EPIDURAL
  Administered 2015-07-19: 4 mL

## 2015-07-19 SURGICAL SUPPLY — 42 items
CLAMP CORD UMBIL (MISCELLANEOUS) IMPLANT
CLOTH BEACON ORANGE TIMEOUT ST (SAFETY) ×3 IMPLANT
CONTAINER PREFILL 10% NBF 15ML (MISCELLANEOUS) IMPLANT
DRSG OPSITE POSTOP 4X10 (GAUZE/BANDAGES/DRESSINGS) ×3 IMPLANT
DURAPREP 26ML APPLICATOR (WOUND CARE) ×3 IMPLANT
ELECT REM PT RETURN 9FT ADLT (ELECTROSURGICAL) ×3
ELECTRODE REM PT RTRN 9FT ADLT (ELECTROSURGICAL) ×1 IMPLANT
EXTRACTOR VACUUM M CUP 4 TUBE (SUCTIONS) IMPLANT
EXTRACTOR VACUUM M CUP 4' TUBE (SUCTIONS)
GLOVE BIO SURGEON STRL SZ 6.5 (GLOVE) ×2 IMPLANT
GLOVE BIO SURGEONS STRL SZ 6.5 (GLOVE) ×1
GLOVE BIOGEL PI IND STRL 7.0 (GLOVE) ×2 IMPLANT
GLOVE BIOGEL PI INDICATOR 7.0 (GLOVE) ×4
GOWN STRL REUS W/TWL LRG LVL3 (GOWN DISPOSABLE) ×6 IMPLANT
KIT ABG SYR 3ML LUER SLIP (SYRINGE) IMPLANT
LIQUID BAND (GAUZE/BANDAGES/DRESSINGS) ×2 IMPLANT
NDL HYPO 25X5/8 SAFETYGLIDE (NEEDLE) IMPLANT
NEEDLE HYPO 22GX1.5 SAFETY (NEEDLE) ×3 IMPLANT
NEEDLE HYPO 25X5/8 SAFETYGLIDE (NEEDLE) IMPLANT
PACK C SECTION WH (CUSTOM PROCEDURE TRAY) ×3 IMPLANT
PAD ABD 7.5X8 STRL (GAUZE/BANDAGES/DRESSINGS) ×4 IMPLANT
PAD OB MATERNITY 4.3X12.25 (PERSONAL CARE ITEMS) ×3 IMPLANT
RTRCTR C-SECT PINK 25CM LRG (MISCELLANEOUS) ×3 IMPLANT
SPONGE GAUZE 4X4 12PLY STER LF (GAUZE/BANDAGES/DRESSINGS) ×6 IMPLANT
SUT MNCRL AB 3-0 PS2 27 (SUTURE) IMPLANT
SUT MON AB 4-0 PS1 27 (SUTURE) ×2 IMPLANT
SUT PLAIN 0 NONE (SUTURE) IMPLANT
SUT PLAIN 2 0 (SUTURE) ×6
SUT PLAIN 2 0 XLH (SUTURE) ×2 IMPLANT
SUT PLAIN ABS 2-0 CT1 27XMFL (SUTURE) ×1 IMPLANT
SUT VIC AB 0 CT1 27 (SUTURE) ×6
SUT VIC AB 0 CT1 27XBRD ANBCTR (SUTURE) ×2 IMPLANT
SUT VIC AB 0 CTX 36 (SUTURE) ×6
SUT VIC AB 0 CTX36XBRD ANBCTRL (SUTURE) ×2 IMPLANT
SUT VIC AB 2-0 CT1 27 (SUTURE) ×3
SUT VIC AB 2-0 CT1 TAPERPNT 27 (SUTURE) ×1 IMPLANT
SUT VIC AB 3-0 SH 27 (SUTURE) ×3
SUT VIC AB 3-0 SH 27X BRD (SUTURE) IMPLANT
SUT VIC AB 4-0 PS2 27 (SUTURE) ×2 IMPLANT
SYR CONTROL 10ML LL (SYRINGE) ×3 IMPLANT
TOWEL OR 17X24 6PK STRL BLUE (TOWEL DISPOSABLE) ×3 IMPLANT
TRAY FOLEY CATH SILVER 14FR (SET/KITS/TRAYS/PACK) ×3 IMPLANT

## 2015-07-19 NOTE — Transfer of Care (Signed)
Immediate Anesthesia Transfer of Care Note  Patient: Shannon Richardson  Procedure(s) Performed: Procedure(s): CESAREAN SECTION (N/A)  Patient Location: PACU  Anesthesia Type:Epidural  Level of Consciousness: awake, alert , oriented and patient cooperative  Airway & Oxygen Therapy: Patient Spontanous Breathing  Post-op Assessment: Report given to RN and Post -op Vital signs reviewed and stable  Post vital signs: Reviewed and stable  Last Vitals:  Temp 98.4 AX BP 125/83 HR 79 RESP 16  POX 99  Complications: No apparent anesthesia complications

## 2015-07-19 NOTE — Progress Notes (Signed)
Subjective: Doing well, pain controled with epidural, UCs q2-3 min  Anesthesia epidural   Objective: BP 116/70 mmHg  Pulse 81  Temp(Src) 98 F (36.7 C) (Oral)  Resp 18  Ht 5\' 7"  (1.702 m)  Wt 230 lb (104.327 kg)  BMI 36.01 kg/m2  SpO2 99%  LMP 10/10/2014   FHT:  FHR: 140 bpm, variability: moderate,  accelerations:  Present,  decelerations:  Present Mild variable decelerations UC:   regular, every 2-3 minutes VE:   Dilation: 10 Effacement (%): 100 Station: 0 Exam by:: s grindstaff rn   AROM done at 8:38 am AF clear Dr. Dellis Filbert   Assessment / Plan: Augmentation of labor, progressing well  Fetal Wellbeing:  Category I Pain Control:  Epidural  Anticipated MOD:  NSVD  Shannon Richardson,Shannon Richardson 07/19/2015, 1:44 PM

## 2015-07-19 NOTE — Anesthesia Procedure Notes (Signed)
Epidural Patient location during procedure: OB  Preanesthetic Checklist Completed: patient identified, site marked, surgical consent, pre-op evaluation, timeout performed, IV checked, risks and benefits discussed and monitors and equipment checked  Epidural Patient position: sitting Prep: site prepped and draped and DuraPrep Patient monitoring: continuous pulse ox and blood pressure Approach: right paramedian Location: L3-L4 Injection technique: LOR air  Needle:  Needle type: Tuohy  Needle gauge: 17 G Needle length: 9 cm and 9 Needle insertion depth: 6 cm Catheter type: closed end flexible Catheter size: 19 Gauge Catheter at skin depth: 12 cm Test dose: negative  Assessment Events: blood not aspirated, injection not painful, no injection resistance, negative IV test and no paresthesia  Additional Notes Dosing of Epidural:  1st dose, through catheter ............................................Marland Kitchen  Xylocaine 40 mg  2nd dose, through catheter, after waiting 3 minutes........Marland KitchenXylocaine 60 mg    As each dose occurred, patient was free of IV sx; and patient exhibited no evidence of SA injection.  Patient is more comfortable after epidural dosed. Please see RN's note for documentation of vital signs,and FHR which are stable.  Patient reminded not to try to ambulate with numb legs, and that an RN must be present when she attempts to get up.

## 2015-07-19 NOTE — Progress Notes (Signed)
Subjective: Doing well, pain controled, UCs q2-3 min  Anesthesia epidural   Objective: BP 125/70 mmHg  Pulse 81  Temp(Src) 98.6 F (37 C) (Oral)  Resp 20  Ht 5\' 7"  (1.702 m)  Wt 230 lb (104.327 kg)  BMI 36.01 kg/m2  SpO2 99%  LMP 10/10/2014   FHT:  FHR: 135 bpm, variability: moderate,  accelerations:  Present,  decelerations:  Present Mild variable UC:   regular, every 2-3 minutes VE:   Complete/Rt Occiput ant/+2 station with mild molding and moderate caput.  3 hours of active pushing after at least 2 hrs of laboring down.  No further descent x 1 1/2 hr.  Maternal fatigue.   Assessment / Plan: Arrest of decent  Fetal Wellbeing:  Category I Pain Control:  Epidural  Anticipated MOD:  Decision to proceed with a Primary Urgent C/S.  Surgery and risks reviewed thoroughly with patient.  Kayle Correa,MARIE-LYNE 07/19/2015, 5:01 PM

## 2015-07-19 NOTE — Anesthesia Preprocedure Evaluation (Addendum)
Anesthesia Evaluation  Patient identified by MRN, date of birth, ID band Patient awake    Reviewed: Allergy & Precautions, H&P , Patient's Chart, lab work & pertinent test results  Airway Mallampati: II  TM Distance: >3 FB Neck ROM: full    Dental  (+) Teeth Intact   Pulmonary    Pulmonary exam normal breath sounds clear to auscultation       Cardiovascular hypertension, Normal cardiovascular exam Rhythm:regular Rate:Normal     Neuro/Psych    GI/Hepatic   Endo/Other    Renal/GU      Musculoskeletal   Abdominal (+) + obese,   Peds  Hematology   Anesthesia Other Findings       Reproductive/Obstetrics (+) Pregnancy                            Anesthesia Physical Anesthesia Plan  ASA: II and emergent  Anesthesia Plan: Epidural   Post-op Pain Management:    Induction:   Airway Management Planned: Natural Airway  Additional Equipment:   Intra-op Plan:   Post-operative Plan:   Informed Consent: I have reviewed the patients History and Physical, chart, labs and discussed the procedure including the risks, benefits and alternatives for the proposed anesthesia with the patient or authorized representative who has indicated his/her understanding and acceptance.   Dental Advisory Given and Dental advisory given  Plan Discussed with: Anesthesiologist, CRNA and Surgeon  Anesthesia Plan Comments: (Labs checked- platelets confirmed with RN in room. Fetal heart tracing, per RN, reported to be stable enough for sitting procedure. Discussed epidural, and patient consents to the procedure:  included risk of possible headache,backache, failed block, allergic reaction, and nerve injury. This patient was asked if she had any questions or concerns before the procedure started.  Patient for C/Section for failure to progress. Will use epidural for C/Section. M. Royce Macadamia, MD)       Anesthesia  Quick Evaluation

## 2015-07-19 NOTE — Progress Notes (Signed)
Dr Dellis Filbert talking to pt about pros and cons for c/section

## 2015-07-19 NOTE — Progress Notes (Signed)
S: comfortable Per pt: EFW 8lb 12 oz  O: Epidural VE 4.5 cm/90 % with edema from 9-1 O"clock/-1 asynclitic bulging membrane  Tracing: baseline 125 (+) accel some variables Ctx q 4-5 mins   IMP: Active phase GBS cx (+) on PCN Fetal macrosomia Term gestation Rh neg Maternal hx melanoma P) right exaggerated sims. Start pitocin. Cont IV PCN defer amniotomy  advised pt and sister of poss shoulder dystocia

## 2015-07-19 NOTE — Op Note (Signed)
Preoperative diagnosis: Intrauterine pregnancy at 39+ weeks                                            Arrest of descent 2nd stage                                            Fetus with Left Club foot  Post operative diagnosis: Same  Anesthesia: Epidural  Anesthesiologist: Dr. Royce Macadamia  Procedure: Primary urgentlow transverse cesarean section  Surgeon: Dr. Princess Bruins  Assistant: Julianne Handler   Estimated blood loss: 950 cc  Procedure:  After being informed of the planned procedure and possible complications including bleeding, infection, injury to other organs, informed consent is obtained. The patient is taken to OR #9 and the level of epidural anesthesia is raised without complication. She is placed in the dorsal decubitus position with the pelvis tilted to the left. She is then prepped and draped in a sterile fashion. A Foley catheter is already inserted in her bladder.  After assessing adequate level of anesthesia, we infiltrate the suprapubic area with 20 cc of Marcaine 0.25 and perform a Pfannenstiel incision which is brought down sharply to the fascia. The fascia is entered in a low transverse fashion. Linea alba is dissected. Peritoneum is entered in a midline fashion. An Alexis retractor is easily positioned. Visceral peritoneum is entered in a low transverse fashion allowing [place] to safely retract bladder by developing a bladder flap.  The myometrium is then entered in a low transverse fashion; first with knife and then extended bluntly. Amniotic fluid is clear. We assist the birth of a female  infant in low posterior cephalic presentation. Mouth and nose are suctioned. The baby is delivered. The cord is clamped and sectioned. The baby is given to the neonatologist present in the room.  10 cc of blood is drawn from the umbilical vein.The placenta is allowed to deliver spontaneously. It is complete and the cord has 3 vessels. Uterine revision is negative.  We proceed with  closure of the myometrium in 2 layers: First with a running locked suture of 0 Vicryl, then with a Lembert suture of 0 Vicryl imbricating the first one. Hemostasis is completed with figure of eights with Vicryl 0 and cauterization on peritoneal edges.  Both paracolic gutters are cleaned. Both tubes and ovaries are assessed and normal. We confirm a satisfactory hemostasis.  Retractors and sponges are removed. Under fascia hemostasis is completed with cauterization.  The parietal peritoneum is closed in a running suture of Vicryl 2-0.  The fascia is then closed with 2 running sutures of 0 Vicryl meeting midline. The wound is irrigated with warm saline and hemostasis is completed with cauterization. The adipose tissue is approximated with Plain 2-0. The skin is closed with a subcuticular suture of 4-0 Vicryl and Dermabond.  A Honeycomb and compressive dressing is applied.  Instrument and sponge count is complete x2. Estimated blood loss is 950 cc.  The procedure is well tolerated by the patient who is taken to recovery room in a well and stable condition.  female baby with Left Club foot and received an Apgar of 9  at 1 minute and 9 at 5 minutes. Weight was pending.    Specimen: Placenta sent to Pathology.  H/O Melanoma complete Excision 5 yrs ago.   Aalivia Mcgraw,MARIE-LYNE MD 3/10/20176:53 PM

## 2015-07-19 NOTE — H&P (Signed)
Shannon Richardson is a 35 y.o. female presenting at term in labor. Intact membrane. PNC notable for fetal left club foot and LGA baby on sonogram. GBS cx (+).  '. Maternal Medical History:  Reason for admission: Contractions.   Contractions: Onset was 2 days ago.   Perceived severity is moderate.    Fetal activity: Perceived fetal activity is normal.    Prenatal complications: Fetal macrosomia  Prenatal Complications - Diabetes: none.    OB History    Gravida Para Term Preterm AB TAB SAB Ectopic Multiple Living   1              Past Medical History  Diagnosis Date  . Hx of varicella   . Pregnancy induced hypertension    Past Surgical History  Procedure Laterality Date  . Melanoma excision     Family History: family history includes Bipolar disorder in her mother; COPD in her maternal grandmother; Cancer in her maternal grandmother; Hypertension in her father. Social History:  reports that she has never smoked. She has never used smokeless tobacco. She reports that she does not drink alcohol or use illicit drugs.   Prenatal Transfer Tool  Maternal Diabetes: No Genetic Screening: Normal Maternal Ultrasounds/Referrals: Abnormal:  Findings:   Other: Fetal Ultrasounds or other Referrals:  Referred to Materal Fetal Medicine  Maternal Substance Abuse:  No Significant Maternal Medications:  None Significant Maternal Lab Results:  Lab values include: Group B Strep positive, Rh negative Other Comments:  fetal left club foot. saw MFM for ? echogenic bowel and ? brain eval. the latter were nl  ROS neg  Dilation: 6 Effacement (%): 100 Station: -1 Exam by:: D Jasso, RN Blood pressure 97/54, pulse 75, temperature 98.6 F (37 C), temperature source Oral, resp. rate 16, height 5\' 7"  (1.702 m), weight 104.327 kg (230 lb), last menstrual period 10/10/2014, SpO2 99 %. Exam Physical Exam  Constitutional: She is oriented to person, place, and time. She appears well-developed and  well-nourished.  HENT:  Head: Atraumatic.  Eyes: EOM are normal.  Neck: Neck supple.  Cardiovascular: Regular rhythm.   Murmur heard. Grade 3/6 SEM  Respiratory: Breath sounds normal.  GI: Soft.  Musculoskeletal: She exhibits edema.  Neurological: She is alert and oriented to person, place, and time.  Skin: Skin is warm and dry.  Psychiatric: She has a normal mood and affect.    Prenatal labs: ABO, Rh: --/--/A NEG (03/09 2256) Antibody: NEG (03/09 2256) Rubella: Immune (10/04 0000) RPR: Nonreactive (10/04 0000)  HBsAg: Negative (10/04 0000)  HIV: Non-reactive (10/04 0000)  GBS: Positive (03/03 0000)   Assessment/Plan: Labor Term gestation  fetal macrosomia Rh negative Left fetal club foot GBS cx positive P) admit routine labs. Amniotomy prn. Analgesic/Epidural prn. Rhophylac pp if indicated. Pitocin augmentation prn. IV PCN, placenta to path for maternal hx of melanoma   Anne Boltz A 07/19/2015, 4:50 AM

## 2015-07-19 NOTE — Lactation Note (Signed)
This note was copied from a baby's chart. Lactation Consultation Note Initial visit at 6 hours of age.  Mom report a brief feeding attempt in PACU and baby has been sleepy.  RN assisted mom with hand expression and unable to collect EBM.  Mom holding baby STS, baby asleep no feeding cues at this time.  Encouraged mom to place baby in crib or call for assist if she is unable to stay awake holding baby.  Mom is very sleepy recovering from c/s.  Mom is too sleepy for much teaching at this time. [facility] LC resources given and discussed.  Encouraged to feed with early cues on demand.  Early newborn behavior discussed.  Mom to call for assist as needed.    Patient Name: Shannon Richardson Today's Date: 07/19/2015 Reason for consult: Initial assessment   Maternal Data Has patient been taught Hand Expression?: Yes Does the patient have breastfeeding experience prior to this delivery?: No  Feeding    LATCH Score/Interventions                      Lactation Tools Discussed/Used     Consult Status Consult Status: Follow-up Date: 07/20/15 Follow-up type: In-patient    Shannon Richardson, Justine Null 07/19/2015, 11:50 PM

## 2015-07-19 NOTE — Progress Notes (Signed)
Pt received Zofran at 1751 and was given 4mg  at 2142. Pt resting and with No complaints of N/V at this time.  Notified Julianne Handler CNM. No new orders.

## 2015-07-19 NOTE — Anesthesia Postprocedure Evaluation (Signed)
Anesthesia Post Note  Patient: Adlean Wirsing  Procedure(s) Performed: Procedure(s) (LRB): CESAREAN SECTION (N/A)  Patient location during evaluation: PACU Anesthesia Type: Epidural Level of consciousness: awake, awake and alert, oriented and patient cooperative Pain management: pain level controlled Vital Signs Assessment: post-procedure vital signs reviewed and stable Respiratory status: spontaneous breathing, nonlabored ventilation and respiratory function stable Cardiovascular status: stable Postop Assessment: no headache, no backache, epidural receding, patient able to bend at knees, no signs of nausea or vomiting and adequate PO intake Anesthetic complications: no Comments: Plt 143k prior to epidural removal. Patient moving bilateral lower extremities.    Last Vitals:  Filed Vitals:   07/19/15 2000 07/19/15 2015  BP: 117/92 124/72  Pulse: 76 85  Temp:  36.6 C  Resp: 22 20    Last Pain:  Filed Vitals:   07/19/15 2024  PainSc: 0-No pain    LLE Motor Response: Purposeful movement (07/19/15 2015)   RLE Motor Response: Purposeful movement (07/19/15 2015)   L Sensory Level: L5-Outer lower leg, top of foot, great toe (07/19/15 2015) R Sensory Level: L5-Outer lower leg, top of foot, great toe (07/19/15 2015)  Catalina Gravel

## 2015-07-20 DIAGNOSIS — D509 Iron deficiency anemia, unspecified: Secondary | ICD-10-CM | POA: Diagnosis present

## 2015-07-20 LAB — CBC
HEMATOCRIT: 24.3 % — AB (ref 36.0–46.0)
HEMOGLOBIN: 8.1 g/dL — AB (ref 12.0–15.0)
MCH: 28.5 pg (ref 26.0–34.0)
MCHC: 33.3 g/dL (ref 30.0–36.0)
MCV: 85.6 fL (ref 78.0–100.0)
Platelets: 152 10*3/uL (ref 150–400)
RBC: 2.84 MIL/uL — ABNORMAL LOW (ref 3.87–5.11)
RDW: 15.6 % — AB (ref 11.5–15.5)
WBC: 14.1 10*3/uL — AB (ref 4.0–10.5)

## 2015-07-20 MED ORDER — MAGNESIUM OXIDE 400 (241.3 MG) MG PO TABS
400.0000 mg | ORAL_TABLET | Freq: Every day | ORAL | Status: DC
  Administered 2015-07-21: 400 mg via ORAL
  Filled 2015-07-20 (×3): qty 1

## 2015-07-20 MED ORDER — POLYSACCHARIDE IRON COMPLEX 150 MG PO CAPS
150.0000 mg | ORAL_CAPSULE | Freq: Every day | ORAL | Status: DC
  Administered 2015-07-21: 150 mg via ORAL
  Filled 2015-07-20: qty 1

## 2015-07-20 NOTE — Lactation Note (Signed)
This note was copied from a baby's chart. Lactation Consultation Note Shannon Richardson is 20 hours of life and he is not feeding consistently. He was spoon fed 2 ml. Initially he held the colostrum in his mouth but as he was fed more he swallowed without prompting.  He has been attaching to the breast briefly suckling and falling asleep. Mom's nipples have a short shaft so [name] may not be feeling the nipple deeply in his mouth. A nipple shield #20 was introduced and he attached. There was colostrum in the shield when he detached.  He is not relaxed at the breast so positioning and latching was a bit of a challenge. Encouraged mom to feed on cue and to ask for help with feedings. Patient Name: Shannon Richardson Today's Date: 07/20/2015 Reason for consult: Initial assessment   Maternal Data    Feeding Feeding Type: Breast Fed Length of feed: 10 min  LATCH Score/Interventions Latch: Repeated attempts needed to sustain latch, nipple held in mouth throughout feeding, stimulation needed to elicit sucking reflex. Intervention(s): Waking techniques Intervention(s): Adjust position;Assist with latch;Breast massage  Audible Swallowing: A few with stimulation  Type of Nipple:  (short shaft)  Comfort (Breast/Nipple): Soft / non-tender     Hold (Positioning): Assistance needed to correctly position infant at breast and maintain latch.  LATCH Score: 6  Lactation Tools Discussed/Used Tools: Nipple Shields Nipple shield size: 20   Consult Status Consult Status: Follow-up Date: 07/21/15 Follow-up type: In-patient    Van Clines 07/20/2015, 3:14 PM

## 2015-07-20 NOTE — Progress Notes (Signed)
POSTOPERATIVE DAY # 1 S/P cesarean section for arrest of descent in second stage  S:         Reports feeling really tired             Tolerating po intake / no nausea / no vomiting / no flatus / no BM             Bleeding is light             Pain controlled with motrin             Up ad lib / ambulatory/ voiding QS  Newborn breast feeding  / Circumcision planned  O:  VS: BP 118/60 mmHg  Pulse 72  Temp(Src) 98.2 F (36.8 C) (Oral)  Resp 20  Ht 5\' 7"  (1.702 m)  Wt 104.327 kg (230 lb)  BMI 36.01 kg/m2  SpO2 99%  LMP 10/10/2014  Breastfeeding? Unknown   LABS:               Recent Labs  07/19/15 1925 07/20/15 0557  WBC 14.2* 14.1*  HGB 9.1* 8.1*  PLT 143* 152               Bloodtype: --/--/A NEG, A NEG (03/09 2256) / newborn B negative                                Rhogam not indicated  Rubella: Immune (10/04 0000)                tdap current                                         I&O: Intake/Output      03/10 0701 - 03/11 0700 03/11 0701 - 03/12 0700   P.O. 240    I.V. (mL/kg) 3881 (37.2)    Total Intake(mL/kg) 4121 (39.5)    Urine (mL/kg/hr) 2900 (1.2)    Blood 1000 (0.4)    Total Output 3900     Net +221                       Physical Exam:             Alert and Oriented X3  Lungs: Clear and unlabored  Heart: regular rate and rhythm / no mumurs  Abdomen: soft, non-tender, mildly distended, hypoactive BS             Fundus: firm, non-tender, Ueven             Dressing intact pressure dressing         Perineum: no edema             Foley draining dark amber urine  Lochia: scant  Extremities: trace pedaledema, no calf pain or tenderness, SCD in place  A:        POD # 1 S/P CS second stage arrest of descent            IDA of pregnancy compounded by ABL from surgery  P:        Routine postoperative care              Encouraged oral fluid intake - increase activity this pm             Will continue iron and  vitamins with magnesium postpartum   Artelia Laroche CNM, MSN, [facility] 07/20/2015, 9:02 AM

## 2015-07-20 NOTE — Addendum Note (Signed)
Addendum  created 07/20/15 [id number] by Rayvon Char, CRNA   Modules edited: Clinical Notes   Clinical Notes:  File: [id number]

## 2015-07-20 NOTE — Anesthesia Postprocedure Evaluation (Signed)
Anesthesia Post Note  Patient: Daleysa Dunklee  Procedure(s) Performed: Procedure(s) (LRB): CESAREAN SECTION (N/A)  Patient location during evaluation: Mother Baby Anesthesia Type: Epidural Level of consciousness: awake and alert Pain management: pain level controlled Vital Signs Assessment: post-procedure vital signs reviewed and stable Respiratory status: spontaneous breathing, nonlabored ventilation and respiratory function stable Cardiovascular status: stable Postop Assessment: no headache, no backache and epidural receding Anesthetic complications: no    Last Vitals:  Filed Vitals:   07/20/15 0239 07/20/15 0349  BP: 115/66 118/60  Pulse: 89 72  Temp:  36.8 C  Resp:  20    Last Pain:  Filed Vitals:   07/20/15 0605  PainSc: 0-No pain                 Lemario Chaikin

## 2015-07-21 ENCOUNTER — Encounter (HOSPITAL_COMMUNITY): Payer: Self-pay | Admitting: Obstetrics & Gynecology

## 2015-07-21 MED ORDER — OXYCODONE-ACETAMINOPHEN 7.5-325 MG PO TABS: 1.0000 | ORAL_TABLET | ORAL | Status: DC | PRN

## 2015-07-21 NOTE — Progress Notes (Signed)
S:   Feels well/pain controled/tolerating po normal diet/N/V none/Ambulating well/Voiding well/Flatus present  Breastfeeding  Newborn well.  Circ done.  O: A&O x 3/NAD Filed Vitals:   07/20/15 1715 07/21/15 0652  BP: 110/55 116/57  Pulse: 88 76  Temp: 98.4 F (36.9 C) 98.3 F (36.8 C)  Resp: 20 18     Labs: Lab Results  Component Value Date   WBC 14.1* 07/20/2015   HGB 8.1* 07/20/2015   HCT 24.3* 07/20/2015   MCV 85.6 07/20/2015   PLT 152 07/20/2015      Intake/Output Summary (Last 24 hours) at 07/21/15 1039 Last data filed at 07/20/15 2228  Gross per 24 hour  Intake      0 ml  Output   1365 ml  Net  -1365 ml     Lungs: clear  Heart: rcr  Abdomen: soft/Bowel sounds: present/Dressing: dry/Insicion: intact  Lochia: normal  Extremities: normal, no oedema           no calf pain/tenderness                       A/P: POD#2 C/S/G1P1001  Continue routine postop orders.  Anemia, FeSO4 325 mg TID.  D/C today.  F/U W.ObGyn 6 wks.  Pamphlet given.  Princess Bruins MD  07/21/2015 at 10:41 am

## 2015-07-21 NOTE — Discharge Summary (Signed)
   Obstetric Discharge Summary Reason for Admission: onset of labor Prenatal Procedures: none Intrapartum Procedures: cesarean: low cervical, transverse Postpartum Procedures: none Complications-Operative and Postpartum: none  Temp:  [97.7 F (36.5 C)-98.4 F (36.9 C)] 98.3 F (36.8 C) (03/12 0652) Pulse Rate:  [76-88] 76 (03/12 0652) Resp:  [18-20] 18 (03/12 0652) BP: (104-116)/(55-57) 116/57 mmHg (03/12 0652) SpO2:  [98 %-99 %] 98 % (03/12 0652) HEMOGLOBIN  Date Value Ref Range Status  07/20/2015 8.1* 12.0 - 15.0 g/dL Final   HCT  Date Value Ref Range Status  07/20/2015 24.3* 36.0 - 46.0 % Final    Hospital Course:  Good  Discharge Diagnoses: Term Pregnancy-delivered  Discharge Information: Date: 07/21/2015 Activity: Maximum lifting 15 Lbs Diet: routine Medications:    Medication List    TAKE these medications        acetaminophen 325 MG tablet  Commonly known as:  TYLENOL  Take 650 mg by mouth every 6 (six) hours as needed for moderate pain or headache.     CELEXA 10 MG tablet  Generic drug:  citalopram  Take 10 mg by mouth daily.     diphenhydrAMINE 25 MG tablet  Commonly known as:  BENADRYL  Take 25 mg by mouth every 6 (six) hours as needed for sleep.     ferrous sulfate 325 (65 FE) MG tablet  Take 325 mg by mouth at bedtime.     oxyCODONE-acetaminophen 7.5-325 MG tablet  Commonly known as:  PERCOCET  Take 1 tablet by mouth every 4 (four) hours as needed for severe pain.     PRENATAL VITAMIN PO  Take 1 tablet by mouth daily.       Condition: stable Instructions: refer to practice specific booklet Discharge to: home     Follow-up Information    Follow up with [place] Delauder,MARIE-LYNE, MD In 6 weeks.   Specialty:  Obstetrics and Gynecology   Contact information:   [address] [place] [address] [phone]       Newborn Data: Live born  Information for the patient's newborn:  Makhari, Despirito Johnesha [id number]  female ; APGAR , ; weight   ;  Home with mother.  Mariel Gaudin,MARIE-LYNE 07/21/2015, 10:45 AM

## 2015-07-21 NOTE — Discharge Instructions (Signed)
Cesarean Delivery, Care After  Refer to this sheet in the next few weeks. These instructions provide you with information on caring for yourself after your procedure. Your health care provider may also give you specific instructions. Your treatment has been planned according to current medical practices, but problems sometimes occur. Call your health care provider if you have any problems or questions after you go home.  HOME CARE INSTRUCTIONS   Only take over-the-counter or prescription medications as directed by your health care provider.   Do not drink alcohol, especially if you are breastfeeding or taking medication to relieve pain.   Do not chew or smoke tobacco.   Continue to use good perineal care. Good perineal care includes:    Wiping your perineum from front to back.    Keeping your perineum clean.   Check your surgical cut (incision) daily for increased redness, drainage, swelling, or separation of skin.   Clean your incision gently with soap and water every day, and then pat it dry. If your health care provider says it is okay, leave the incision uncovered. Use a bandage (dressing) if the incision is draining fluid or appears irritated. If the adhesive strips across the incision do not fall off within 7 days, carefully peel them off.   Hug a pillow when coughing or sneezing until your incision is healed. This helps to relieve pain.   Do not use tampons or douche until your health care provider says it is okay.   Shower, wash your hair, and take tub baths as directed by your health care provider.   Wear a well-fitting bra that provides breast support.   Limit wearing support panties or control-top hose.   Drink enough fluids to keep your urine clear or pale yellow.   Eat high-fiber foods such as whole grain cereals and breads, brown rice, beans, and fresh fruits and vegetables every day. These foods may help prevent or relieve constipation.   Resume activities such as climbing stairs,  driving, lifting, exercising, or traveling as directed by your health care provider.   Talk to your health care provider about resuming sexual activities. This is dependent upon your risk of infection, your rate of healing, and your comfort and desire to resume sexual activity.   Try to have someone help you with your household activities and your newborn for at least a few days after you leave the hospital.   Rest as much as possible. Try to rest or take a nap when your newborn is sleeping.   Increase your activities gradually.   Keep all of your scheduled postpartum appointments. It is very important to keep your scheduled follow-up appointments. At these appointments, your health care provider will be checking to make sure that you are healing physically and emotionally.  SEEK MEDICAL CARE IF:    You are passing large clots from your vagina. Save any clots to show your health care provider.   You have a foul smelling discharge from your vagina.   You have trouble urinating.   You are urinating frequently.   You have pain when you urinate.   You have a change in your bowel movements.   You have increasing redness, pain, or swelling near your incision.   You have pus draining from your incision.   Your incision is separating.   You have painful, hard, or reddened breasts.   You have a severe headache.   You have blurred vision or see spots.   You feel sad   or depressed.   You have thoughts of hurting yourself or your newborn.   You have questions about your care, the care of your newborn, or medications.   You are dizzy or light-headed.   You have a rash.   You have pain, redness, or swelling at the site of the removed intravenous access (IV) tube.   You have nausea or vomiting.   You stopped breastfeeding and have not had a menstrual period within 12 weeks of stopping.   You are not breastfeeding and have not had a menstrual period within 12 weeks of delivery.   You have a fever.  SEEK  IMMEDIATE MEDICAL CARE IF:   You have persistent pain.   You have chest pain.   You have shortness of breath.   You faint.   You have leg pain.   You have stomach pain.   Your vaginal bleeding saturates 2 or more sanitary pads in 1 hour.  MAKE SURE YOU:    Understand these instructions.   Will watch your condition.   Will get help right away if you are not doing well or get worse.     This information is not intended to replace advice given to you by your health care provider. Make sure you discuss any questions you have with your health care provider.     Document Released: 01/17/2002 Document Revised: 05/18/2014 Document Reviewed: 12/23/2011  Elsevier Interactive Patient Education 2016 [employer]

## 2015-07-23 ENCOUNTER — Encounter (HOSPITAL_COMMUNITY): Payer: Self-pay | Admitting: *Deleted

## 2015-07-23 ENCOUNTER — Inpatient Hospital Stay (HOSPITAL_COMMUNITY)
Admission: AD | Admit: 2015-07-23 | Discharge: 2015-07-24 | DRG: 776 | Disposition: A | Discharge status: Home / Self Care | Payer: BLUE CROSS/BLUE SHIELD | Source: Ambulatory Visit | Attending: Obstetrics & Gynecology | Admitting: Obstetrics & Gynecology

## 2015-07-23 DIAGNOSIS — O8612 Endometritis following delivery: Secondary | ICD-10-CM | POA: Diagnosis present

## 2015-07-23 DIAGNOSIS — Z79899 Other long term (current) drug therapy: Secondary | ICD-10-CM

## 2015-07-23 DIAGNOSIS — R5082 Postprocedural fever: Secondary | ICD-10-CM | POA: Diagnosis present

## 2015-07-23 DIAGNOSIS — D649 Anemia, unspecified: Secondary | ICD-10-CM | POA: Diagnosis present

## 2015-07-23 DIAGNOSIS — N719 Inflammatory disease of uterus, unspecified: Secondary | ICD-10-CM | POA: Diagnosis present

## 2015-07-23 MED ORDER — OXYCODONE-ACETAMINOPHEN 5-325 MG PO TABS
1.0000 | ORAL_TABLET | ORAL | Status: DC | PRN
  Administered 2015-07-23 – 2015-07-24 (×2): 1 via ORAL
  Filled 2015-07-23 (×2): qty 1

## 2015-07-23 MED ORDER — DEXTROSE 5 % IV SOLN
2.0000 g | Freq: Two times a day (BID) | INTRAVENOUS | Status: DC
  Administered 2015-07-23 – 2015-07-24 (×3): 2 g via INTRAVENOUS
  Filled 2015-07-23 (×5): qty 2

## 2015-07-23 MED ORDER — IBUPROFEN 600 MG PO TABS
600.0000 mg | ORAL_TABLET | Freq: Four times a day (QID) | ORAL | Status: DC | PRN
  Administered 2015-07-23 – 2015-07-24 (×3): 600 mg via ORAL
  Filled 2015-07-23 (×3): qty 1

## 2015-07-23 MED ORDER — LACTATED RINGERS IV SOLN
INTRAVENOUS | Status: DC
  Administered 2015-07-23 – 2015-07-24 (×3): via INTRAVENOUS

## 2015-07-23 NOTE — Lactation Note (Addendum)
Lactation Consultation Note  Patient Name: Shannon Richardson Today's Date: 07/23/2015   Initial consult on mother who has been readmitted today 4 days post partum for uterine infection. Called to consult on mom due to blood in milk pumped from left breast earlier today. There is notable frank blood in container of milk that was pumped.  Examined breasts which are filling with milk. Mom with small short shafted nipples with no visible excoriation/cracks. She does have a faint positional stripe to upper right areola. She reports she has had a fever but has no other signs of Mastitis. No redness noted to breasts. Mom has been pumping at home and mainly bottle feeding infant due to difficult latch. Mom reports her milk started coming in last night. She reports she did latch him on last night with the use of a NS but not since.  She has begun pumping since arriving at hospital and then the frank blood was noted. She pumped 20 cc from left side and 30 cc from right side. Mom reports she is turning suction up high, enc her to decrease suction to about 4 gtts with pumping. Enc mom to keep pumping every 2-3 hours for 15 minutes on Maintenance setting.  Informed mom that it is ok to keep feeding infant at breast if mom and baby able to tolerate. Discussed that large amounts of blood in milk may cause some stomach upset but generally passes through infant gut and may have red in stools if ingests blood tinged milk.   Engorgement prevention/treatment reviewed with mom. Follow up tomorrow and prn. Report to Gerald Stabs, Therapist, sports.         Maternal Data    Feeding    LATCH Score/Interventions                      Lactation Tools Discussed/Used     Consult Status      Donn Pierini 07/23/2015, 6:50 PM

## 2015-07-23 NOTE — Progress Notes (Signed)
I received a referral from RN, Vonzella Nipple to see pt.  She was tearful at being separated from her baby, but feels that he is safer at home with her sister so that he is not potentially exposed to germs at the hospital.  She feels comfortable with him being with her sister, she just wants to be spending time with him and bonding.  She was appreciative that I checked in on her.  I also let her know that she could reach out at anytime for support.  Chaplain Janne Napoleon, [place] Pager, [phone] 12:46 PM    07/23/15 1200  Clinical Encounter Type  Visited With Patient  Visit Type Spiritual support  Referral From Nurse  Spiritual Encounters  Spiritual Needs Emotional

## 2015-07-23 NOTE — H&P (Signed)
Shannon Richardson is an 35 y.o. female G1P1001  POp C/S day #4  RA:  PP C/S   Pertinent Gynecological History:  Blood transfusions: none Sexually transmitted diseases: no past history Previous GYN Procedures: None Last pap: normal OB History: G1P1001   Menstrual History: Patient's last menstrual period was 10/10/2014.    Past Medical History  Diagnosis Date  . Hx of varicella   . Pregnancy induced hypertension     Past Surgical History  Procedure Laterality Date  . Melanoma excision    . Cesarean section N/A 07/19/2015    Procedure: CESAREAN SECTION;  Surgeon: Princess Bruins, MD;  Location: [facility];  Service: Obstetrics;  Laterality: N/A;    Family History  Problem Relation Age of Onset  . Bipolar disorder Mother   . Hypertension Father   . COPD Maternal Grandmother   . Cancer Maternal Grandmother     lung    Social History:  reports that she has never smoked. She has never used smokeless tobacco. She reports that she does not drink alcohol or use illicit drugs.  Allergies:  Allergies  Allergen Reactions  . Adhesive [Tape] Rash    Prescriptions prior to admission  Medication Sig Dispense Refill Last Dose  . citalopram (CELEXA) 10 MG tablet Take 10 mg by mouth daily.   07/22/2015 at Unknown time  . ferrous sulfate 325 (65 FE) MG tablet Take 325 mg by mouth at bedtime.   07/22/2015 at Unknown time  . ibuprofen (ADVIL,MOTRIN) 600 MG tablet Take 600 mg by mouth every 6 (six) hours as needed for moderate pain.   07/23/2015 at 0800  . oxyCODONE-acetaminophen (PERCOCET) 7.5-325 MG tablet Take 1 tablet by mouth every 4 (four) hours as needed for severe pain. 30 tablet 0 07/23/2015 at 1000  . Prenatal Vit-Fe Fumarate-FA (PRENATAL VITAMIN PO) Take 1 tablet by mouth daily.    07/23/2015 at Unknown time  . acetaminophen (TYLENOL) 325 MG tablet Take 650 mg by mouth every 6 (six) hours as needed for moderate pain or headache.   prn    ROS neg  Blood pressure 117/69, pulse 110,  temperature 99.6 F (37.6 C), temperature source Oral, resp. rate 18, height 5\' 7"  (1.702 m), weight 228 lb (103.42 kg), last menstrual period 10/10/2014, SpO2 96 %, unknown if currently breastfeeding. Physical Exam  NAD Lungs clear bilat Breasts wnl RCR, no murmur Abdo soft.  BS present.           C/S incision intact           U/H 0/4 mod tender           CVAT neg bilaterally Oedema bilat at ankles.  No evidence of DVT.  CBC at [facility] office WBC wnl 6+  Assessment/Plan: Probable PP C/S Endometritis.  Cefotan 2 g IV q12 hrs.  Will d/c after 24 hrs afebrile and continue on PO ABTx then.  Repeat CBC with diff tomorrow am.  U. Culture before starting ABTx. Pumping breast milk.   Wilgus Deyton,MARIE-LYNE 07/23/2015, 1:13 PM

## 2015-07-24 DIAGNOSIS — N719 Inflammatory disease of uterus, unspecified: Secondary | ICD-10-CM | POA: Diagnosis present

## 2015-07-24 LAB — CBC
HCT: 20.9 % — ABNORMAL LOW (ref 36.0–46.0)
HEMOGLOBIN: 6.9 g/dL — AB (ref 12.0–15.0)
MCH: 28.2 pg (ref 26.0–34.0)
MCHC: 33 g/dL (ref 30.0–36.0)
MCV: 85.3 fL (ref 78.0–100.0)
Platelets: 180 10*3/uL (ref 150–400)
RBC: 2.45 MIL/uL — AB (ref 3.87–5.11)
RDW: 15.4 % (ref 11.5–15.5)
WBC: 8.8 10*3/uL (ref 4.0–10.5)

## 2015-07-24 LAB — HEMOGLOBIN AND HEMATOCRIT, BLOOD
HEMATOCRIT: 21.2 % — AB (ref 36.0–46.0)
HEMOGLOBIN: 6.8 g/dL — AB (ref 12.0–15.0)

## 2015-07-24 LAB — URINE CULTURE

## 2015-07-24 MED ORDER — POLYSACCHARIDE IRON COMPLEX 150 MG PO CAPS
150.0000 mg | ORAL_CAPSULE | Freq: Two times a day (BID) | ORAL | Status: DC
  Administered 2015-07-24: 150 mg via ORAL
  Filled 2015-07-24 (×3): qty 1

## 2015-07-24 MED ORDER — SODIUM CHLORIDE 0.9 % IV SOLN
510.0000 mg | Freq: Once | INTRAVENOUS | Status: AC
  Administered 2015-07-24: 510 mg via INTRAVENOUS
  Filled 2015-07-24: qty 17

## 2015-07-24 MED ORDER — MAGNESIUM OXIDE 400 (241.3 MG) MG PO TABS
400.0000 mg | ORAL_TABLET | Freq: Every day | ORAL | Status: DC
  Administered 2015-07-24: 400 mg via ORAL
  Filled 2015-07-24 (×2): qty 1

## 2015-07-24 MED ORDER — AMOXICILLIN-POT CLAVULANATE 875-125 MG PO TABS: 1.0000 | ORAL_TABLET | Freq: Two times a day (BID) | ORAL | Status: AC

## 2015-07-24 NOTE — Discharge Instructions (Signed)
Endometritis °Endometritis is an irritation, soreness, and swelling (inflammation) of the lining of the uterus (endometrium).  °CAUSES  °· Bacterial infections. °· Sexually transmitted infections (STIs). °· Having a miscarriage or childbirth, especially after a long labor or cesarean delivery. °· Certain gynecological procedures (such as dilation and curettage, hysteroscopy, or contraceptive insertion). °SIGNS AND SYMPTOMS  °· Fever. °· Lower abdominal or pelvic pain. °· Abnormal vaginal discharge or bleeding. °· Abdominal bloating (distention) or swelling. °· General discomfort or ill feeling. °· Discomfort with bowel movements. °DIAGNOSIS  °A physical and pelvic exam are performed. Other tests may include: °· Cultures from the cervix. °· Blood tests. °· Examining a tissue sample of the uterine lining (endometrial biopsy). °· Examining discharge under a microscope (wet prep). °· Laparoscopy. °TREATMENT  °Antibiotic medicines are usually given. Other treatments may include: °· Fluids through an IV tube inserted in your vein. °· Rest. °HOME CARE INSTRUCTIONS  °· Take over-the-counter or prescription medicines for pain, discomfort, or fever as directed by your health care provider. °· Take your antibiotics as directed. Finish them even if you start to feel better. °· Resume your normal diet and activities as directed or as tolerated. °· Do not douche or have sexual intercourse until your health care provider says it is okay. °· Do not have sexual intercourse until your partner has been treated if your endometritis is caused by an STI. °SEEK IMMEDIATE MEDICAL CARE IF:  °· You have swelling or increasing pain in the abdomen. °· You have a fever. °· You have bad smelling vaginal discharge, or you have an increased amount of discharge. °· You have abnormal vaginal bleeding. °· Your medicine is not helping with the pain. °· You experience any problems that may be related to the medicine you are taking. °· You have nausea  and vomiting, or you cannot keep foods down. °· You have pain with bowel movements. °MAKE SURE YOU:  °· Understand these instructions. °· Will watch your condition. °· Will get help right away if you are not doing well or get worse. °  °This information is not intended to replace advice given to you by your health care provider. Make sure you discuss any questions you have with your health care provider. °  °Document Released: 04/21/2001 Document Revised: 12/28/2012 Document Reviewed: 11/24/2012 °Elsevier Interactive Patient Education ©2016 [employer] ° °

## 2015-07-24 NOTE — Progress Notes (Signed)
Pt verbalizes understanding of d/c instructions, medications, follow up appts, when to seek medical attention and belongings policy. I encouraged pt to check room thoroughly for her belongings. Pt has no questions at this time. Pt has no IV at time of d/c. Pts neighbor is here and will be driving her home. Pt was given a copy of d/c instructions and her prescription. Pt was escorted to the main entrance by NT. Shannon Richardson

## 2015-07-24 NOTE — Progress Notes (Signed)
Shannon Bruins MD 07/24/15 POD #5 C/S  Readmitted for probable Endometritis                        Cefotan 2 g IV q12 hrs  S:   Feels better/pain mild/tolerating po well/N/V none/Ambulating well/Voiding normally/Flatus present/BMs wnl  Breast wnl  Newborn Breastfeeding/pumping  O: A&O x 3/ Filed Vitals:   07/24/15 1145 07/24/15 1200  BP: 128/74 128/74  Pulse: 100 97  Temp: 100 F (37.8 C) 100.1 F (37.8 C)  Resp: 18 16     Labs: Lab Results  Component Value Date   WBC 8.8 07/24/2015   HGB 6.8* 07/24/2015   HCT 21.2* 07/24/2015   MCV 85.3 07/24/2015   PLT 180 07/24/2015      Intake/Output Summary (Last 24 hours) at 07/24/15 1301 Last data filed at 07/24/15 1150  Gross per 24 hour  Intake 4296.67 ml  Output   4975 ml  Net -678.33 ml     Lungs: clear  Heart: rcr  Abdomen: soft/Bowel sounds: present/UH 0/5 less tender/Insicion: intact  Lochia: normal  Extremities: normal, no oedema           no calf pain/tenderness                         A/P: POD5C/S/G1P1001             PP Endometritis with good response to IV ABTx.  Breast congestion/milkdown, no evidence of Mastitis.  D/C home on Augmentin.             Severe Anemia, Hb stable.  IV Iron given.  Shannon Bruins MD 07/24/15 at 1:05 pm

## 2015-07-24 NOTE — Progress Notes (Signed)
TC from nursing - panic level hgb 6.9 Previous HGB 8.1 on 3/11 - noted urine dark amber so level likely concentrated at that time No further bleeding - only light to scant bleeding since discharge  WBC 8.8 HGB 6.9 HCT 20.9 PLT 180  Net I&O net negative 973  Plan: Polysacride iron BID and magnesium started           Evaluate on rounds           Update MD after AM assessment  Artelia Laroche CNM [facility]

## 2015-07-24 NOTE — Discharge Summary (Signed)
  Physician Discharge Summary  Patient ID: Shannon Richardson MRN: [id number] DOB/AGE: 33-May-1982 33 y.o.  Admit date: 07/23/2015 Discharge date: 07/24/2015  Admission Diagnoses: PP,FEVER  Discharge Diagnoses: PP,FEVER        Active Problems:   Endometritis   Discharged Condition: good  Hospital Course: good  Consults: None  Treatments: antibiotics: Cefotan 2 g IV q12 hours  Disposition: 01-Home or Self Care                       Augmentin 1 tab BID x 7 days    Medication List    ASK your doctor about these medications        acetaminophen 325 MG tablet  Commonly known as:  TYLENOL  Take 650 mg by mouth every 6 (six) hours as needed for moderate pain or headache.     CELEXA 10 MG tablet  Generic drug:  citalopram  Take 10 mg by mouth daily.     ferrous sulfate 325 (65 FE) MG tablet  Take 325 mg by mouth at bedtime.     ibuprofen 600 MG tablet  Commonly known as:  ADVIL,MOTRIN  Take 600 mg by mouth every 6 (six) hours as needed for moderate pain.     oxyCODONE-acetaminophen 7.5-325 MG tablet  Commonly known as:  PERCOCET  Take 1 tablet by mouth every 4 (four) hours as needed for severe pain.     PRENATAL VITAMIN PO  Take 1 tablet by mouth daily.           Follow-up Information    Follow up with Rufus Beske,MARIE-LYNE, MD In 5 weeks.   Specialty:  Obstetrics and Gynecology   Contact information:   [place] [address] [phone]       Signed: Princess Bruins, MD 07/24/2015, 1:19 PM

## 2015-07-24 NOTE — Progress Notes (Signed)
Dr. Dellis Filbert notified of pts slightly elevated temps. No new orders at this time. Will proceed with d/c. Marry Guan

## 2015-07-24 NOTE — Lactation Note (Signed)
Lactation Consultation Note  Follow up visit with mom.  She is receiving blood.  She states she is pumping but not as much as she should.  Encouraged mom to do her best pumping every 3 hours so she doesn't lose her milk supply.  Mom voices understanding.  She states milk from left breast is still blood tinged.  No visible trauma noted on nipple.  Mom had been pumping on high suction initially.  Reassured mom and discussed watching over next few days.  Encouraged to call with questions or concerns.  Patient Name: Shannon Richardson Date: 07/24/2015     Maternal Data    Feeding    LATCH Score/Interventions                      Lactation Tools Discussed/Used     Consult Status      Ave Filter 07/24/2015, 12:12 PM

## 2015-07-24 NOTE — Progress Notes (Signed)
Critical Hemoglobin 6.9 called to T. Cricket, CNM @ [phone], no new orders.      Results for Shannon Richardson, Shannon Richardson (MRN [id number]) as of 07/24/2015 06:06  Ref. Range 07/24/2015 05:40  WBC Latest Ref Range: 4.0-10.5 K/uL 8.8  RBC Latest Ref Range: 3.87-5.11 MIL/uL 2.45 (L)  Hemoglobin Latest Ref Range: 12.0-15.0 g/dL 6.9 (LL)  HCT Latest Ref Range: 36.0-46.0 % 20.9 (L)  MCV Latest Ref Range: 78.0-100.0 fL 85.3  MCH Latest Ref Range: 26.0-34.0 pg 28.2  MCHC Latest Ref Range: 30.0-36.0 g/dL 33.0  RDW Latest Ref Range: 11.5-15.5 % 15.4  Platelets Latest Ref Range: 150-400 K/uL 180

## 2015-08-22 DIAGNOSIS — F909 Attention-deficit hyperactivity disorder, unspecified type: Secondary | ICD-10-CM | POA: Diagnosis not present

## 2015-08-22 DIAGNOSIS — F4011 Social phobia, generalized: Secondary | ICD-10-CM | POA: Diagnosis not present

## 2015-09-18 DIAGNOSIS — Z85828 Personal history of other malignant neoplasm of skin: Secondary | ICD-10-CM | POA: Diagnosis not present

## 2015-09-18 DIAGNOSIS — D485 Neoplasm of uncertain behavior of skin: Secondary | ICD-10-CM | POA: Diagnosis not present

## 2015-09-18 DIAGNOSIS — L821 Other seborrheic keratosis: Secondary | ICD-10-CM | POA: Diagnosis not present

## 2015-09-18 DIAGNOSIS — Z85820 Personal history of malignant melanoma of skin: Secondary | ICD-10-CM | POA: Diagnosis not present

## 2015-09-18 DIAGNOSIS — D225 Melanocytic nevi of trunk: Secondary | ICD-10-CM | POA: Diagnosis not present

## 2015-09-18 DIAGNOSIS — D224 Melanocytic nevi of scalp and neck: Secondary | ICD-10-CM | POA: Diagnosis not present

## 2015-09-18 DIAGNOSIS — D2261 Melanocytic nevi of right upper limb, including shoulder: Secondary | ICD-10-CM | POA: Diagnosis not present

## 2015-10-09 DIAGNOSIS — Z30431 Encounter for routine checking of intrauterine contraceptive device: Secondary | ICD-10-CM | POA: Diagnosis not present

## 2015-11-20 DIAGNOSIS — F4011 Social phobia, generalized: Secondary | ICD-10-CM | POA: Diagnosis not present

## 2015-11-20 DIAGNOSIS — F909 Attention-deficit hyperactivity disorder, unspecified type: Secondary | ICD-10-CM | POA: Diagnosis not present

## 2016-03-05 DIAGNOSIS — F4312 Post-traumatic stress disorder, chronic: Secondary | ICD-10-CM | POA: Diagnosis not present

## 2016-03-05 DIAGNOSIS — F4011 Social phobia, generalized: Secondary | ICD-10-CM | POA: Diagnosis not present

## 2016-03-05 DIAGNOSIS — F909 Attention-deficit hyperactivity disorder, unspecified type: Secondary | ICD-10-CM | POA: Diagnosis not present

## 2016-03-07 IMAGING — US US MFM OB FOLLOW-UP
1 series · 14 of 28 positions shown · non-contrast
Comparison: none

[Series 1: us mfm ob follow-up · 14 of 39 slices shown]
[im 2/39]
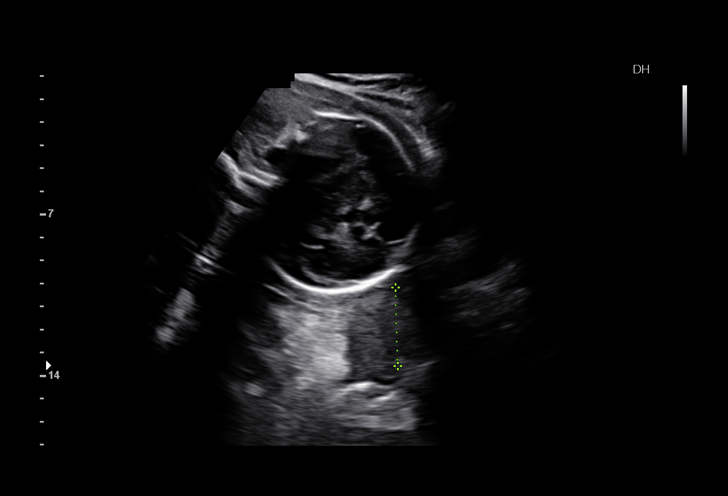
[im 5/39]
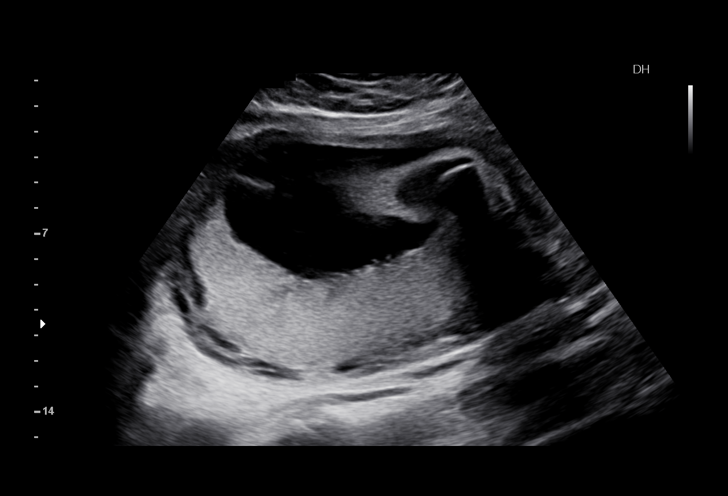
[im 8/39]
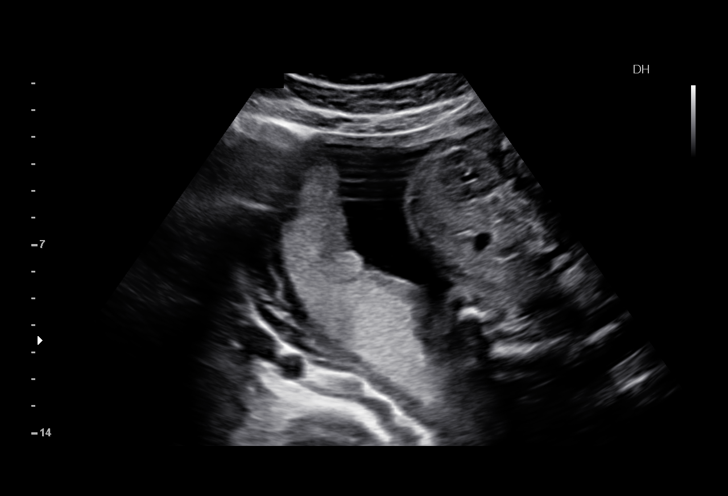
[im 10/39]
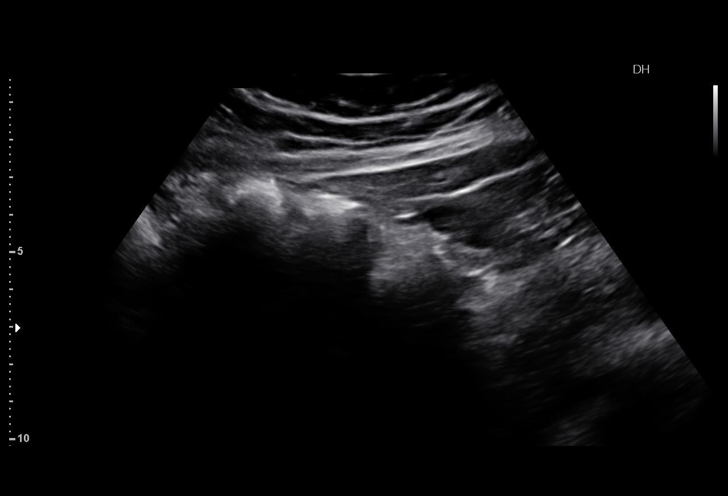
[im 13/39]
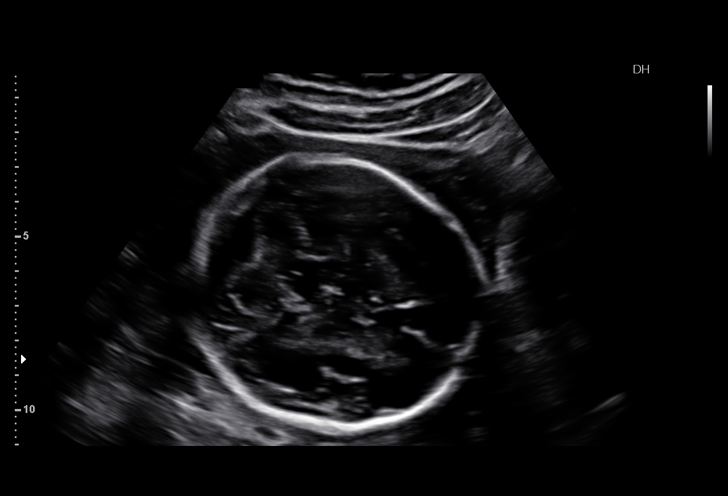
[im 16/39]
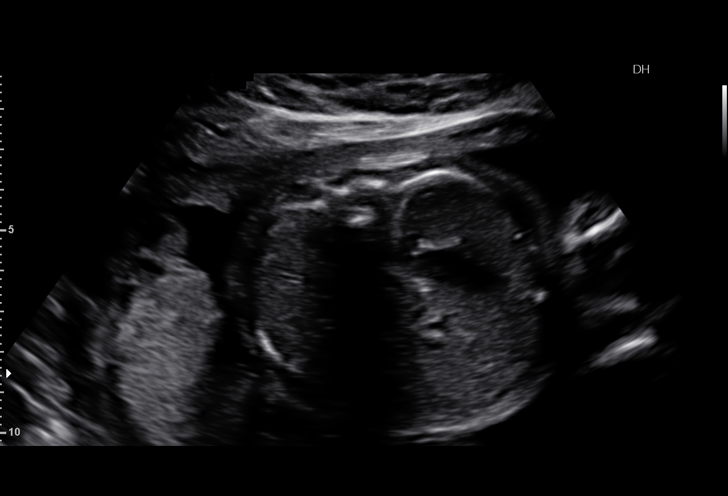
[im 19/39]
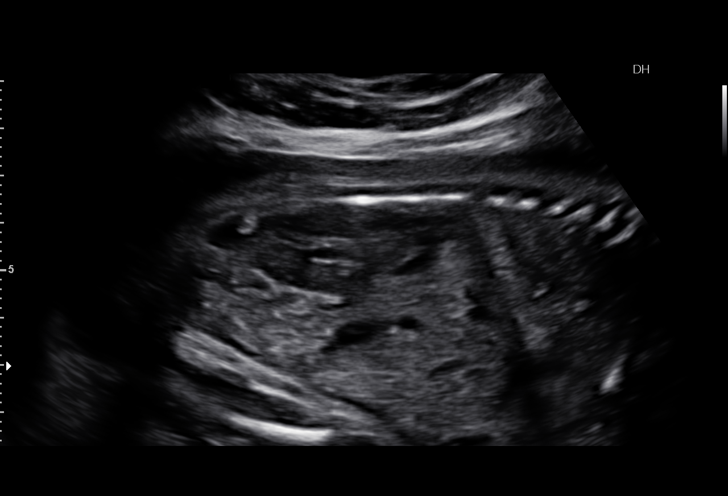
[im 22/39]
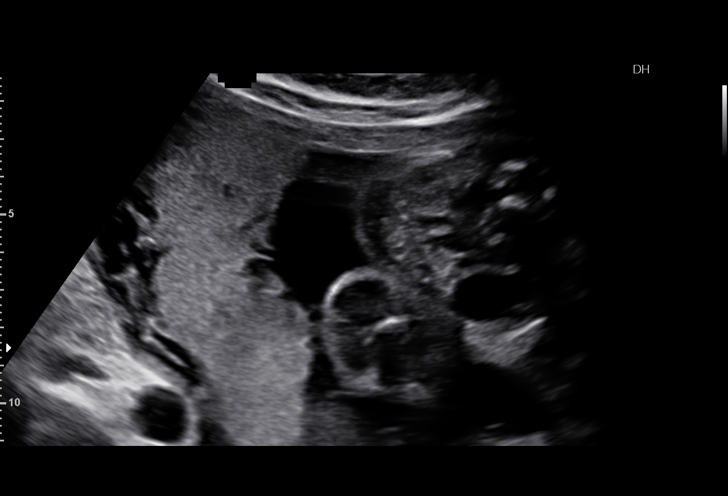
[im 24/39]
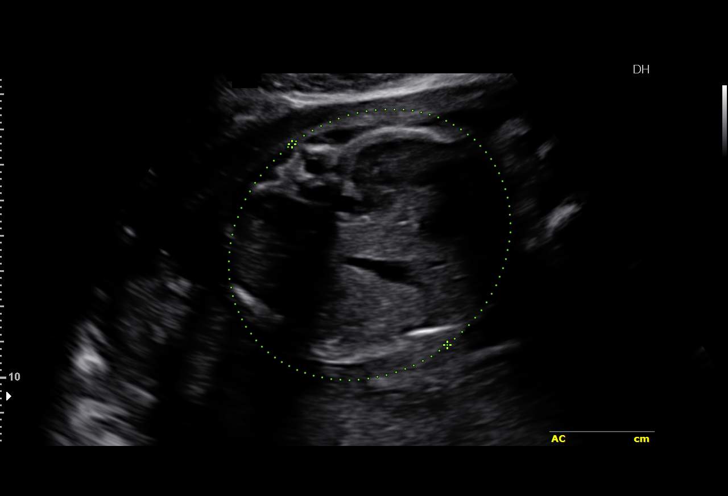
[im 27/39]
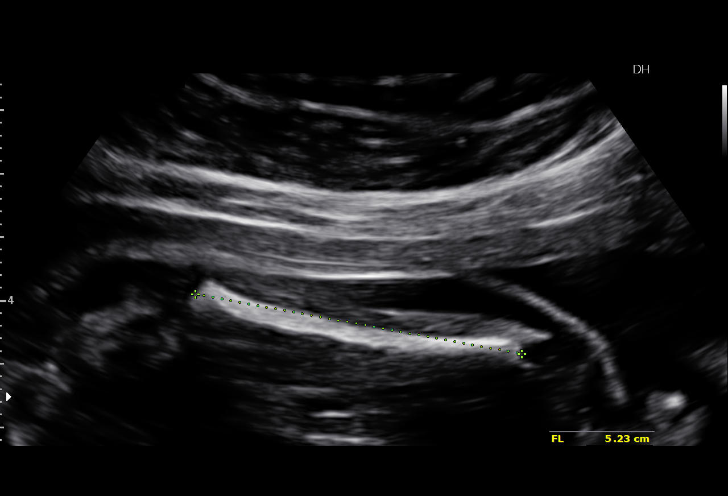
[im 30/39]
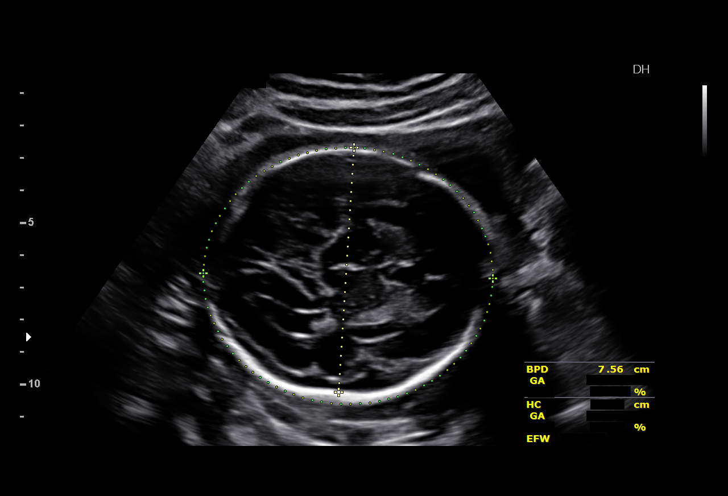
[im 33/39]
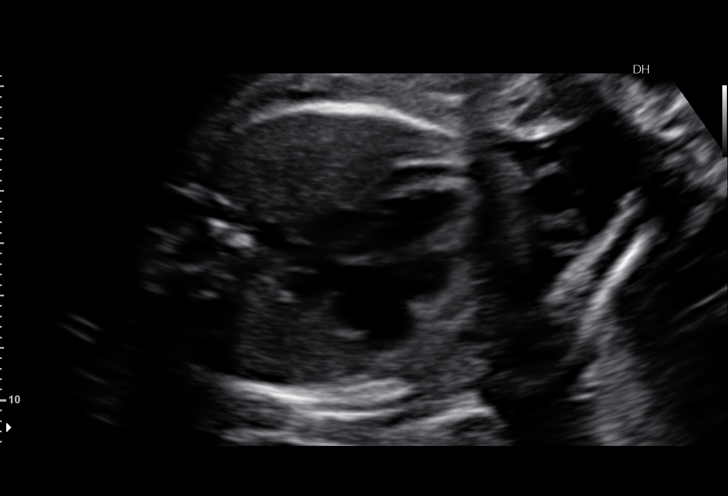
[im 36/39]
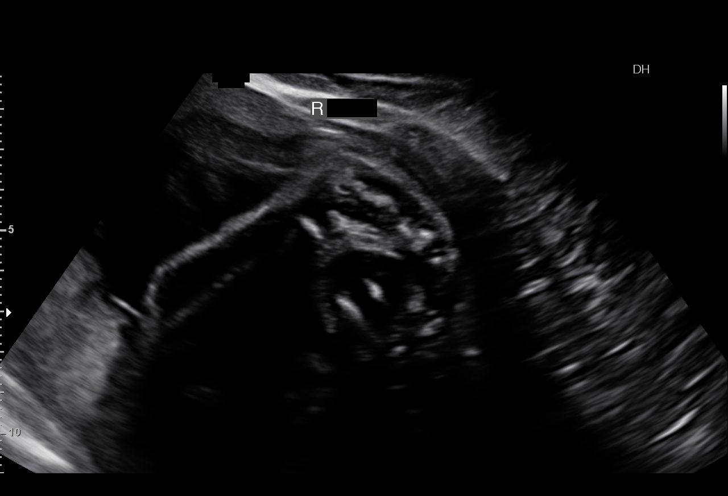
[im 39/39]
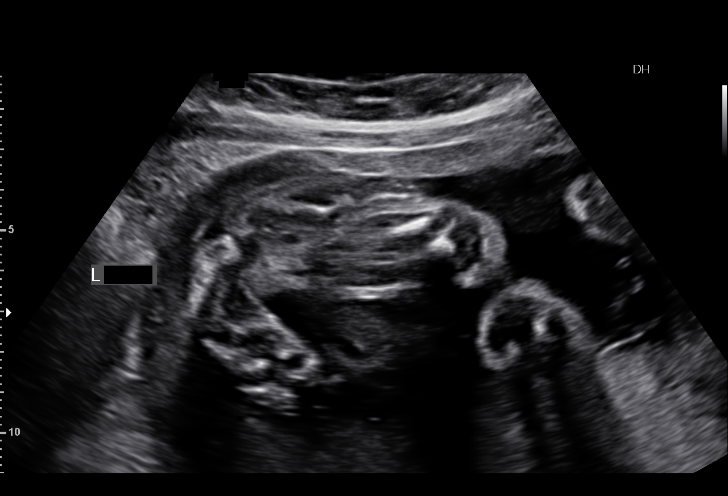

[14 of 28 positions shown; findings below may reference images not displayed]

pm)

Name:       AHIJO SIDEEQ                        Visit  04/23/2015 [DATE]
Date:

[facility]                                     OB/Gyn and
Infertility
[address] [HOSP[facility]] [facility]

1  PORESTA PURCELADZE            [id number]       [phone]     [id number]
Indications

27 weeks gestation of pregnancy
Left club foot; S/P consult with peds ortho
OB History

Gravidity:     1
Fetal Evaluation

Num Of Fetuses:      1
Fetal Heart          144
Rate(bpm):
Cardiac Activity:    Observed
Presentation:        Cephalic
Placenta:            Posterior, above cervical os
P. Cord Insertion:   Previously Visualized

Amniotic Fluid
AFI FV:      Subjectively within normal limits
Biometry

BPD:      75.5  mm     G. Age:   30w 2d                  CI:        81.77   %    70 - 86
FL/HC:      19.8   %    18.6 -
HC:      263.5  mm     G. Age:   28w 5d        72   %    HC/AC:      1.09        1.05 -
AC:      242.5  mm     G. Age:   28w 4d        81   %    FL/BPD      69.1   %    71 - 87
:
FL:       52.2  mm     G. Age:   27w 6d        56   %    FL/AC:      21.5   %    20 - 24
HUM:      47.5  mm     G. Age:   27w 6d        64   %
CER:      32.7  mm     G. Age:   28w 5d        72   %
CM:        8.8  mm

Est.        0993   gm   2 lb 11 oz      77   %
FW:
Gestational Age

U/S Today:     28w 6d                                         EDD:   07/10/15
Best:          27w 1d    Det. By:   Early Ultrasound          EDD:   07/22/15
(11/27/14)
Anatomy

Cranium:          Appears normal         Aortic Arch:       Previously seen
Fetal Cavum:      Appears normal         Ductal Arch:       Previously seen
Ventricles:       Appears normal         Diaphragm:         Previously seen
Choroid Plexus:   Previously seen        Stomach:           Appears normal,
left sided
Cerebellum:       Appears normal         Abdomen:           Appears normal
Posterior         Appears normal         Abdominal          Previously seen
Fossa:                                   Wall:
Nuchal Fold:      Not applicable (>20    Cord Vessels:      Previously seen
wks GA)
Face:             Orbits and profile     Kidneys:           Appear normal
previously seen
Lips:             Previously seen        Bladder:           Appears normal
Heart:            Appears normal         Spine:             Previously seen
(4CH, axis, and
situs)
RVOT:             Previously seen        Upper              Previously seen
Extremities:
LVOT:             Previously seen        Lower              LEFT Clubfoot
Extremities:

Other:   Male gender previously seen. Right heel appears normal. Nasal
bone previously visualized. Technically difficult due to fetal position.
Cervix Uterus Adnexa

Cervix
Length:             3.4  cm.
Normal appearance by transabdominal scan.

Left Ovary
Not visualized.

Right Ovary
Within normal limits.

Adnexa:       No abnormality visualized.
Impression

Singleton intrauterine pregnancy at 27 week 1 day
gestation with fetal cardiac activity
Cephalic presentation
Posterior placenta without evidence of previa
Normal appearing fetal growth and amniotic fluid volume
Left clubfoot noted; otherwise no birth defects noted on
fetal anatomic survey although the ultrasound today was
limited by fetal position
Cervical length of 
3.4 cm
Recommendations

Follow-up ultrasounds as clinically indicated.

## 2016-06-03 DIAGNOSIS — F4011 Social phobia, generalized: Secondary | ICD-10-CM | POA: Diagnosis not present

## 2016-06-03 DIAGNOSIS — F909 Attention-deficit hyperactivity disorder, unspecified type: Secondary | ICD-10-CM | POA: Diagnosis not present

## 2016-10-22 DIAGNOSIS — Z85820 Personal history of malignant melanoma of skin: Secondary | ICD-10-CM | POA: Diagnosis not present

## 2016-10-22 DIAGNOSIS — D2261 Melanocytic nevi of right upper limb, including shoulder: Secondary | ICD-10-CM | POA: Diagnosis not present

## 2016-10-22 DIAGNOSIS — D1801 Hemangioma of skin and subcutaneous tissue: Secondary | ICD-10-CM | POA: Diagnosis not present

## 2016-10-22 DIAGNOSIS — D225 Melanocytic nevi of trunk: Secondary | ICD-10-CM | POA: Diagnosis not present

## 2016-10-22 DIAGNOSIS — Z85828 Personal history of other malignant neoplasm of skin: Secondary | ICD-10-CM | POA: Diagnosis not present

## 2016-11-30 DIAGNOSIS — F4011 Social phobia, generalized: Secondary | ICD-10-CM | POA: Diagnosis not present

## 2016-11-30 DIAGNOSIS — F909 Attention-deficit hyperactivity disorder, unspecified type: Secondary | ICD-10-CM | POA: Diagnosis not present

## 2017-01-29 DIAGNOSIS — J00 Acute nasopharyngitis [common cold]: Secondary | ICD-10-CM | POA: Diagnosis not present

## 2017-03-04 ENCOUNTER — Encounter: Payer: Self-pay | Admitting: Obstetrics & Gynecology

## 2017-03-04 ENCOUNTER — Ambulatory Visit (INDEPENDENT_AMBULATORY_CARE_PROVIDER_SITE_OTHER): Payer: BLUE CROSS/BLUE SHIELD | Admitting: Obstetrics & Gynecology

## 2017-03-04 VITALS — BP 114/70 | Ht 67.0 in | Wt 167.0 lb

## 2017-03-04 DIAGNOSIS — Z975 Presence of (intrauterine) contraceptive device: Secondary | ICD-10-CM

## 2017-03-04 DIAGNOSIS — Z1151 Encounter for screening for human papillomavirus (HPV): Secondary | ICD-10-CM | POA: Diagnosis not present

## 2017-03-04 DIAGNOSIS — Z01419 Encounter for gynecological examination (general) (routine) without abnormal findings: Secondary | ICD-10-CM | POA: Diagnosis not present

## 2017-03-04 NOTE — Patient Instructions (Signed)
1. Encounter for routine gynecological examination with Papanicolaou smear of cervix Normal gyn exam.  Pap/HPV HR done.  Breasts wnl.  2. IUD contraception Mirena IUD in good position, well tolerated.  Insertion 09/2015.  Mild menses every 3 weeks recently, wnl.  If bleeding pattern worsens, will call back to try low dose BCPs up to 3 packs and Pelvic US if no improvement.  Shannon Richardson, it was a pleasure seeing you today!  I will inform you of your results as soon as available.  Health Maintenance, Female Adopting a healthy lifestyle and getting preventive care can go a long way to promote health and wellness. Talk with your health care provider about what schedule of regular examinations is right for you. This is a good chance for you to check in with your provider about disease prevention and staying healthy. In between checkups, there are plenty of things you can do on your own. Experts have done a lot of research about which lifestyle changes and preventive measures are most likely to keep you healthy. Ask your health care provider for more information. Weight and diet Eat a healthy diet  Be sure to include plenty of vegetables, fruits, low-fat dairy products, and lean protein.  Do not eat a lot of foods high in solid fats, added sugars, or salt.  Get regular exercise. This is one of the most important things you can do for your health. ? Most adults should exercise for at least 150 minutes each week. The exercise should increase your heart rate and make you sweat (moderate-intensity exercise). ? Most adults should also do strengthening exercises at least twice a week. This is in addition to the moderate-intensity exercise.  Maintain a healthy weight  Body mass index (BMI) is a measurement that can be used to identify possible weight problems. It estimates body fat based on height and weight. Your health care provider can help determine your BMI and help you achieve or maintain a healthy  weight.  For females 101 years of age and older: ? A BMI below 18.5 is considered underweight. ? A BMI of 18.5 to 24.9 is normal. ? A BMI of 25 to 29.9 is considered overweight. ? A BMI of 30 and above is considered obese.  Watch levels of cholesterol and blood lipids  You should start having your blood tested for lipids and cholesterol at 35 years of age, then have this test every 5 years.  You may need to have your cholesterol levels checked more often if: ? Your lipid or cholesterol levels are high. ? You are older than 34 years of age. ? You are at high risk for heart disease.  Cancer screening Lung Cancer  Lung cancer screening is recommended for adults 37-31 years old who are at high risk for lung cancer because of a history of smoking.  A yearly low-dose CT scan of the lungs is recommended for people who: ? Currently smoke. ? Have quit within the past 15 years. ? Have at least a 30-pack-year history of smoking. A pack year is smoking an average of one pack of cigarettes a day for 1 year.  Yearly screening should continue until it has been 15 years since you quit.  Yearly screening should stop if you develop a health problem that would prevent you from having lung cancer treatment.  Breast Cancer  Practice breast self-awareness. This means understanding how your breasts normally appear and feel.  It also means doing regular breast self-exams. Let your health care  provider know about any changes, no matter how small.  If you are in your 20s or 30s, you should have a clinical breast exam (CBE) by a health care provider every 1-3 years as part of a regular health exam.  If you are 44 or older, have a CBE every year. Also consider having a breast X-ray (mammogram) every year.  If you have a family history of breast cancer, talk to your health care provider about genetic screening.  If you are at high risk for breast cancer, talk to your health care provider about having an  MRI and a mammogram every year.  Breast cancer gene (BRCA) assessment is recommended for women who have family members with BRCA-related cancers. BRCA-related cancers include: ? Breast. ? Ovarian. ? Tubal. ? Peritoneal cancers.  Results of the assessment will determine the need for genetic counseling and BRCA1 and BRCA2 testing.  Cervical Cancer Your health care provider may recommend that you be screened regularly for cancer of the pelvic organs (ovaries, uterus, and vagina). This screening involves a pelvic examination, including checking for microscopic changes to the surface of your cervix (Pap test). You may be encouraged to have this screening done every 3 years, beginning at age 39.  For women ages 55-65, health care providers may recommend pelvic exams and Pap testing every 3 years, or they may recommend the Pap and pelvic exam, combined with testing for human papilloma virus (HPV), every 5 years. Some types of HPV increase your risk of cervical cancer. Testing for HPV may also be done on women of any age with unclear Pap test results.  Other health care providers may not recommend any screening for nonpregnant women who are considered low risk for pelvic cancer and who do not have symptoms. Ask your health care provider if a screening pelvic exam is right for you.  If you have had past treatment for cervical cancer or a condition that could lead to cancer, you need Pap tests and screening for cancer for at least 20 years after your treatment. If Pap tests have been discontinued, your risk factors (such as having a new sexual partner) need to be reassessed to determine if screening should resume. Some women have medical problems that increase the chance of getting cervical cancer. In these cases, your health care provider may recommend more frequent screening and Pap tests.  Colorectal Cancer  This type of cancer can be detected and often prevented.  Routine colorectal cancer screening  usually begins at 34 years of age and continues through 34 years of age.  Your health care provider may recommend screening at an earlier age if you have risk factors for colon cancer.  Your health care provider may also recommend using home test kits to check for hidden blood in the stool.  A small camera at the end of a tube can be used to examine your colon directly (sigmoidoscopy or colonoscopy). This is done to check for the earliest forms of colorectal cancer.  Routine screening usually begins at age 35.  Direct examination of the colon should be repeated every 5-10 years through 34 years of age. However, you may need to be screened more often if early forms of precancerous polyps or small growths are found.  Skin Cancer  Check your skin from head to toe regularly.  Tell your health care provider about any new moles or changes in moles, especially if there is a change in a mole's shape or color.  Also tell your  health care provider if you have a mole that is larger than the size of a pencil eraser.  Always use sunscreen. Apply sunscreen liberally and repeatedly throughout the day.  Protect yourself by wearing long sleeves, pants, a wide-brimmed hat, and sunglasses whenever you are outside.  Heart disease, diabetes, and high blood pressure  High blood pressure causes heart disease and increases the risk of stroke. High blood pressure is more likely to develop in: ? People who have blood pressure in the high end of the normal range (130-139/85-89 mm Hg). ? People who are overweight or obese. ? People who are African American.  If you are 6-77 years of age, have your blood pressure checked every 3-5 years. If you are 14 years of age or older, have your blood pressure checked every year. You should have your blood pressure measured twice-once when you are at a hospital or clinic, and once when you are not at a hospital or clinic. Record the average of the two measurements. To check  your blood pressure when you are not at a hospital or clinic, you can use: ? An automated blood pressure machine at a pharmacy. ? A home blood pressure monitor.  If you are between 35 years and 30 years old, ask your health care provider if you should take aspirin to prevent strokes.  Have regular diabetes screenings. This involves taking a blood sample to check your fasting blood sugar level. ? If you are at a normal weight and have a low risk for diabetes, have this test once every three years after 34 years of age. ? If you are overweight and have a high risk for diabetes, consider being tested at a younger age or more often. Preventing infection Hepatitis B  If you have a higher risk for hepatitis B, you should be screened for this virus. You are considered at high risk for hepatitis B if: ? You were born in a country where hepatitis B is common. Ask your health care provider which countries are considered high risk. ? Your parents were born in a high-risk country, and you have not been immunized against hepatitis B (hepatitis B vaccine). ? You have HIV or AIDS. ? You use needles to inject street drugs. ? You live with someone who has hepatitis B. ? You have had sex with someone who has hepatitis B. ? You get hemodialysis treatment. ? You take certain medicines for conditions, including cancer, organ transplantation, and autoimmune conditions.  Hepatitis C  Blood testing is recommended for: ? Everyone born from [birth year] through 1963. ? Anyone with known risk factors for hepatitis C.  Sexually transmitted infections (STIs)  You should be screened for sexually transmitted infections (STIs) including gonorrhea and chlamydia if: ? You are sexually active and are younger than 34 years of age. ? You are older than 34 years of age and your health care provider tells you that you are at risk for this type of infection. ? Your sexual activity has changed since you were last screened and you  are at an increased risk for chlamydia or gonorrhea. Ask your health care provider if you are at risk.  If you do not have HIV, but are at risk, it may be recommended that you take a prescription medicine daily to prevent HIV infection. This is called pre-exposure prophylaxis (PrEP). You are considered at risk if: ? You are sexually active and do not regularly use condoms or know the HIV status of your partner(s). ?  You take drugs by injection. ? You are sexually active with a partner who has HIV.  Talk with your health care provider about whether you are at high risk of being infected with HIV. If you choose to begin PrEP, you should first be tested for HIV. You should then be tested every 3 months for as long as you are taking PrEP. Pregnancy  If you are premenopausal and you may become pregnant, ask your health care provider about preconception counseling.  If you may become pregnant, take 400 to 800 micrograms (mcg) of folic acid every day.  If you want to prevent pregnancy, talk to your health care provider about birth control (contraception). Osteoporosis and menopause  Osteoporosis is a disease in which the bones lose minerals and strength with aging. This can result in serious bone fractures. Your risk for osteoporosis can be identified using a bone density scan.  If you are 65 years of age or older, or if you are at risk for osteoporosis and fractures, ask your health care provider if you should be screened.  Ask your health care provider whether you should take a calcium or vitamin D supplement to lower your risk for osteoporosis.  Menopause may have certain physical symptoms and risks.  Hormone replacement therapy may reduce some of these symptoms and risks. Talk to your health care provider about whether hormone replacement therapy is right for you. Follow these instructions at home:  Schedule regular health, dental, and eye exams.  Stay current with your  immunizations.  Do not use any tobacco products including cigarettes, chewing tobacco, or electronic cigarettes.  If you are pregnant, do not drink alcohol.  If you are breastfeeding, limit how much and how often you drink alcohol.  Limit alcohol intake to no more than 1 drink per day for nonpregnant women. One drink equals 12 ounces of beer, 5 ounces of wine, or 1 ounces of hard liquor.  Do not use street drugs.  Do not share needles.  Ask your health care provider for help if you need support or information about quitting drugs.  Tell your health care provider if you often feel depressed.  Tell your health care provider if you have ever been abused or do not feel safe at home. This information is not intended to replace advice given to you by your health care provider. Make sure you discuss any questions you have with your health care provider. Document Released: 11/10/2010 Document Revised: 10/03/2015 Document Reviewed: 01/29/2015 Elsevier Interactive Patient Education  2018 [employer]  

## 2017-03-04 NOTE — Progress Notes (Signed)
Shannon Richardson [birth year] [id number]   History:    36 y.o. G1P1L1 Married.  Son is 3 1/2 yo, doing well  RP:  Established patient presenting for annual gyn exam   HPI:  Well on Mirena x 09/2015.  Had no menses until 2 months ago.  Last 2 months, had light menses every 3 weeks.  No pelvic pain.  Normal vaginal secretions.  Breasts wnl.  Mictions/BMs wnl.  Past medical history,surgical history, family history and social history were all reviewed and documented in the EPIC chart.  Gynecologic History Patient's last menstrual period was 03/04/2017. Contraception: IUD Last Pap: 2016. Results were: Negative, HPV HR neg Last mammogram: Never  Obstetric History OB History  Gravida Para Term Preterm AB Living  1 1 1     1   SAB TAB Ectopic Multiple Live Births        0 1    # Outcome Date GA Lbr Len/2nd Weight Sex Delivery Anes PTL Lv  1 Term 07/19/15 [redacted]w[redacted]d 17:01 / 06:16 8 lb 11.2 oz (3.945 kg) M CS-LTranv EPI  LIV       ROS: A ROS was performed and pertinent positives and negatives are included in the history.  GENERAL: No fevers or chills. HEENT: No change in vision, no earache, sore throat or sinus congestion. NECK: No pain or stiffness. CARDIOVASCULAR: No chest pain or pressure. No palpitations. PULMONARY: No shortness of breath, cough or wheeze. GASTROINTESTINAL: No abdominal pain, nausea, vomiting or diarrhea, melena or bright red blood per rectum. GENITOURINARY: No urinary frequency, urgency, hesitancy or dysuria. MUSCULOSKELETAL: No joint or muscle pain, no back pain, no recent trauma. DERMATOLOGIC: No rash, no itching, no lesions. ENDOCRINE: No polyuria, polydipsia, no heat or cold intolerance. No recent change in weight. HEMATOLOGICAL: No anemia or easy bruising or bleeding. NEUROLOGIC: No headache, seizures, numbness, tingling or weakness. PSYCHIATRIC: No depression, no loss of interest in normal activity or change in sleep pattern.     Exam:   BP 114/70   Ht 5\' 7"  (1.702 m)    Wt 167 lb (75.8 kg)   LMP 03/04/2017   BMI 26.16 kg/m   Body mass index is 26.16 kg/m.  General appearance : Well developed well nourished female. No acute distress HEENT: Eyes: no retinal hemorrhage or exudates,  Neck supple, trachea midline, no carotid bruits, no thyroidmegaly Lungs: Clear to auscultation, no rhonchi or wheezes, or rib retractions  Heart: Regular rate and rhythm, no murmurs or gallops Breast:Examined in sitting and supine position were symmetrical in appearance, no palpable masses or tenderness,  no skin retraction, no nipple inversion, no nipple discharge, no skin discoloration, no axillary or supraclavicular lymphadenopathy Abdomen: no palpable masses or tenderness, no rebound or guarding Extremities: no edema or skin discoloration or tenderness  Pelvic: Vulva normal  Bartholin, Urethra, Skene Glands: Within normal limits             Vagina: No gross lesions or discharge  Cervix: No gross lesions or discharge.  Strings visible.  Pap/HPV HR done.  Uterus  AV, normal size, shape and consistency, non-tender and mobile  Adnexa  Without masses or tenderness  Anus and perineum  normal    Assessment/Plan:  36 y.o. female for annual exam   1. Encounter for routine gynecological examination with Papanicolaou smear of cervix Normal gyn exam.  Pap/HPV HR done.  Breasts wnl.  2. IUD contraception Mirena IUD in good position, well tolerated.  Insertion 09/2015.  Mild menses every 3 weeks  recently, wnl.  If bleeding pattern worsens, will call back to try low dose BCPs up to 3 packs and Pelvic US if no improvement.  Princess Bruins MD, 8:18 AM 03/04/2017

## 2017-03-08 LAB — PAP, TP IMAGING W/ HPV RNA, RFLX HPV TYPE 16,18/45: HPV DNA High Risk: NOT DETECTED

## 2017-03-19 ENCOUNTER — Encounter: Payer: Self-pay | Admitting: Obstetrics & Gynecology

## 2017-03-19 ENCOUNTER — Ambulatory Visit: Payer: BLUE CROSS/BLUE SHIELD | Admitting: Obstetrics & Gynecology

## 2017-03-19 VITALS — BP 120/84

## 2017-03-19 DIAGNOSIS — R87615 Unsatisfactory cytologic smear of cervix: Secondary | ICD-10-CM

## 2017-03-19 NOTE — Progress Notes (Signed)
    Jamariyah Caldwell 06-04-1980 [id number]        36 y.o.  G1P1001 Married  RP:  Repeat Pap d/t insufficient TZ cells  HPI:  No change x last visit.  Pap 03/04/2017 Negative, satisfactory, but no TZ cells present.  No previous abnormal pap, but patient preferred repeating pap now for a better sample.  Past medical history,surgical history, problem list, medications, allergies, family history and social history were all reviewed and documented in the EPIC chart.  Directed ROS with pertinent positives and negatives documented in the history of present illness/assessment and plan.  Exam:  Vitals:   03/19/17 1242  BP: 120/84   General appearance:  Normal  Gyn exam:  Vulva normal.  Speculum:  Cervix/Vagina normal.  Pap done.   Assessment/Plan:  36 y.o. G1P1001   1. Encounter for repeat Pap smear due to previous insufficient cervical cells Repeat Pap done.  Patient reassured.    Princess Bruins MD, 12:48 PM 03/19/2017

## 2017-03-22 ENCOUNTER — Encounter: Payer: Self-pay | Admitting: Obstetrics & Gynecology

## 2017-03-22 NOTE — Patient Instructions (Signed)
1. Encounter for repeat Pap smear due to previous insufficient cervical cells Repeat Pap done.  Patient reassured.    Shannon Richardson, good seeing you today!

## 2017-03-23 LAB — PAP IG W/ RFLX HPV ASCU

## 2017-06-07 DIAGNOSIS — Z713 Dietary counseling and surveillance: Secondary | ICD-10-CM | POA: Diagnosis not present

## 2017-06-17 DIAGNOSIS — F909 Attention-deficit hyperactivity disorder, unspecified type: Secondary | ICD-10-CM | POA: Diagnosis not present

## 2017-06-17 DIAGNOSIS — F4011 Social phobia, generalized: Secondary | ICD-10-CM | POA: Diagnosis not present

## 2017-07-08 DIAGNOSIS — Z713 Dietary counseling and surveillance: Secondary | ICD-10-CM | POA: Diagnosis not present

## 2017-09-07 DIAGNOSIS — Z6827 Body mass index (BMI) 27.0-27.9, adult: Secondary | ICD-10-CM | POA: Diagnosis not present

## 2017-09-07 DIAGNOSIS — Z713 Dietary counseling and surveillance: Secondary | ICD-10-CM | POA: Diagnosis not present

## 2017-12-23 DIAGNOSIS — F4312 Post-traumatic stress disorder, chronic: Secondary | ICD-10-CM | POA: Diagnosis not present

## 2017-12-23 DIAGNOSIS — R69 Illness, unspecified: Secondary | ICD-10-CM | POA: Diagnosis not present

## 2017-12-23 DIAGNOSIS — F4011 Social phobia, generalized: Secondary | ICD-10-CM | POA: Diagnosis not present

## 2018-02-16 DIAGNOSIS — L821 Other seborrheic keratosis: Secondary | ICD-10-CM | POA: Diagnosis not present

## 2018-02-16 DIAGNOSIS — D2272 Melanocytic nevi of left lower limb, including hip: Secondary | ICD-10-CM | POA: Diagnosis not present

## 2018-02-16 DIAGNOSIS — D225 Melanocytic nevi of trunk: Secondary | ICD-10-CM | POA: Diagnosis not present

## 2018-02-16 DIAGNOSIS — D2262 Melanocytic nevi of left upper limb, including shoulder: Secondary | ICD-10-CM | POA: Diagnosis not present

## 2018-02-16 DIAGNOSIS — Z85828 Personal history of other malignant neoplasm of skin: Secondary | ICD-10-CM | POA: Diagnosis not present

## 2018-02-16 DIAGNOSIS — D2271 Melanocytic nevi of right lower limb, including hip: Secondary | ICD-10-CM | POA: Diagnosis not present

## 2018-02-16 DIAGNOSIS — Z85820 Personal history of malignant melanoma of skin: Secondary | ICD-10-CM | POA: Diagnosis not present

## 2018-02-16 DIAGNOSIS — D1801 Hemangioma of skin and subcutaneous tissue: Secondary | ICD-10-CM | POA: Diagnosis not present

## 2018-02-16 DIAGNOSIS — D224 Melanocytic nevi of scalp and neck: Secondary | ICD-10-CM | POA: Diagnosis not present

## 2018-02-16 DIAGNOSIS — D2261 Melanocytic nevi of right upper limb, including shoulder: Secondary | ICD-10-CM | POA: Diagnosis not present

## 2018-03-07 ENCOUNTER — Encounter: Payer: Self-pay | Admitting: Obstetrics & Gynecology

## 2018-03-07 ENCOUNTER — Ambulatory Visit (INDEPENDENT_AMBULATORY_CARE_PROVIDER_SITE_OTHER): Payer: 59 | Admitting: Obstetrics & Gynecology

## 2018-03-07 VITALS — BP 102/72 | Ht 67.0 in | Wt 168.0 lb

## 2018-03-07 DIAGNOSIS — Z01419 Encounter for gynecological examination (general) (routine) without abnormal findings: Secondary | ICD-10-CM | POA: Diagnosis not present

## 2018-03-07 DIAGNOSIS — Z30431 Encounter for routine checking of intrauterine contraceptive device: Secondary | ICD-10-CM | POA: Diagnosis not present

## 2018-03-07 LAB — TSH: TSH: 1.67 m[IU]/L

## 2018-03-07 NOTE — Progress Notes (Signed)
Sherre Berkley 23-Feb-1981 [id number]   History:    37 y.o. G1P1L1 Married.  Son is 2 1/2 yo  RP:  Established patient presenting for annual gyn exam   HPI: Well on Mirena IUD x 09/2015.  Lites normal manses.  No pelvic pain.  Urine and bowel movements normal.  Breasts normal.  Body mass index 26.31.  Physically active.  No risk factor for cardiovascular disease.  Mother with Hypothyroidism.  Past medical history,surgical history, family history and social history were all reviewed and documented in the EPIC chart.  Gynecologic History Patient's last menstrual period was 03/07/2018. Contraception: Mirena IUD x 09/2015 Last Pap: 03/2017.  Results were: Negative. Last mammogram: Never Bone Density: Never Colonoscopy: Never  Obstetric History OB History  Gravida Para Term Preterm AB Living  1 1 1     1   SAB TAB Ectopic Multiple Live Births        0 1    # Outcome Date GA Lbr Len/2nd Weight Sex Delivery Anes PTL Lv  1 Term 07/19/15 [redacted]w[redacted]d 17:01 / 06:16 8 lb 11.2 oz (3.945 kg) M CS-LTranv EPI  LIV     ROS: A ROS was performed and pertinent positives and negatives are included in the history.  GENERAL: No fevers or chills. HEENT: No change in vision, no earache, sore throat or sinus congestion. NECK: No pain or stiffness. CARDIOVASCULAR: No chest pain or pressure. No palpitations. PULMONARY: No shortness of breath, cough or wheeze. GASTROINTESTINAL: No abdominal pain, nausea, vomiting or diarrhea, melena or bright red blood per rectum. GENITOURINARY: No urinary frequency, urgency, hesitancy or dysuria. MUSCULOSKELETAL: No joint or muscle pain, no back pain, no recent trauma. DERMATOLOGIC: No rash, no itching, no lesions. ENDOCRINE: No polyuria, polydipsia, no heat or cold intolerance. No recent change in weight. HEMATOLOGICAL: No anemia or easy bruising or bleeding. NEUROLOGIC: No headache, seizures, numbness, tingling or weakness. PSYCHIATRIC: No depression, no loss of interest in normal  activity or change in sleep pattern.     Exam:   BP 102/72   Ht 5\' 7"  (1.702 m)   Wt 168 lb (76.2 kg)   LMP 03/07/2018 Comment: MIRENA  BMI 26.31 kg/m   Body mass index is 26.31 kg/m.  General appearance : Well developed well nourished female. No acute distress HEENT: Eyes: no retinal hemorrhage or exudates,  Neck supple, trachea midline, no carotid bruits, no thyroidmegaly Lungs: Clear to auscultation, no rhonchi or wheezes, or rib retractions  Heart: Regular rate and rhythm, no murmurs or gallops Breast:Examined in sitting and supine position were symmetrical in appearance, no palpable masses or tenderness,  no skin retraction, no nipple inversion, no nipple discharge, no skin discoloration, no axillary or supraclavicular lymphadenopathy Abdomen: no palpable masses or tenderness, no rebound or guarding Extremities: no edema or skin discoloration or tenderness  Pelvic: Vulva: Normal             Vagina: No gross lesions or discharge  Cervix: No gross lesions or discharge.  Strings felt on exam.  Uterus  AV, normal size, shape and consistency, non-tender and mobile  Adnexa  Without masses or tenderness  Anus: Normal   Assessment/Plan:  37 y.o. female for annual exam   1. Well female exam with routine gynecological exam Normal gynecologic exam.  Pap negative in October 2018.  Will repeat at 2 to 3 years.  Breast exam normal.  Body mass index 26.31.  Continue with regular physical activity and healthy nutrition.  No risk factor for  cardiovascular disease or diabetes.  Mother with hypothyroidism.  Will do a TSH today. - TSH  2. Encounter for routine checking of intrauterine contraceptive device (IUD) Mirena IUD since May 2017.  Well-tolerated and in good position.  Princess Bruins MD, 8:45 AM 03/07/2018

## 2018-03-10 ENCOUNTER — Encounter: Payer: Self-pay | Admitting: Obstetrics & Gynecology

## 2018-03-10 NOTE — Patient Instructions (Signed)
  1. Well female exam with routine gynecological exam Normal gynecologic exam.  Pap negative in October 2018.  Will repeat at 2 to 3 years.  Breast exam normal.  Body mass index 26.31.  Continue with regular physical activity and healthy nutrition.  No risk factor for cardiovascular disease or diabetes.  Mother with hypothyroidism.  Will do a TSH today. - TSH  2. Encounter for routine checking of intrauterine contraceptive device (IUD) Mirena IUD since May 2017.  Well-tolerated and in good position.  Haiden, it was a pleasure seeing you today!  I will inform you of your results as soon as they are available.

## 2018-04-11 DIAGNOSIS — J0110 Acute frontal sinusitis, unspecified: Secondary | ICD-10-CM | POA: Diagnosis not present

## 2018-06-27 DIAGNOSIS — F4312 Post-traumatic stress disorder, chronic: Secondary | ICD-10-CM | POA: Diagnosis not present

## 2018-06-27 DIAGNOSIS — R69 Illness, unspecified: Secondary | ICD-10-CM | POA: Diagnosis not present

## 2018-06-27 DIAGNOSIS — F909 Attention-deficit hyperactivity disorder, unspecified type: Secondary | ICD-10-CM | POA: Diagnosis not present

## 2018-09-21 DIAGNOSIS — N390 Urinary tract infection, site not specified: Secondary | ICD-10-CM | POA: Diagnosis not present

## 2018-09-26 DIAGNOSIS — R69 Illness, unspecified: Secondary | ICD-10-CM | POA: Diagnosis not present

## 2018-09-26 DIAGNOSIS — F4312 Post-traumatic stress disorder, chronic: Secondary | ICD-10-CM | POA: Diagnosis not present

## 2018-09-26 DIAGNOSIS — F909 Attention-deficit hyperactivity disorder, unspecified type: Secondary | ICD-10-CM | POA: Diagnosis not present

## 2018-12-07 DIAGNOSIS — F4011 Social phobia, generalized: Secondary | ICD-10-CM | POA: Diagnosis not present

## 2018-12-07 DIAGNOSIS — F4312 Post-traumatic stress disorder, chronic: Secondary | ICD-10-CM | POA: Diagnosis not present

## 2018-12-07 DIAGNOSIS — F909 Attention-deficit hyperactivity disorder, unspecified type: Secondary | ICD-10-CM | POA: Diagnosis not present

## 2018-12-07 DIAGNOSIS — R69 Illness, unspecified: Secondary | ICD-10-CM | POA: Diagnosis not present

## 2018-12-09 DIAGNOSIS — Z85828 Personal history of other malignant neoplasm of skin: Secondary | ICD-10-CM | POA: Diagnosis not present

## 2018-12-09 DIAGNOSIS — D2261 Melanocytic nevi of right upper limb, including shoulder: Secondary | ICD-10-CM | POA: Diagnosis not present

## 2018-12-09 DIAGNOSIS — Z85820 Personal history of malignant melanoma of skin: Secondary | ICD-10-CM | POA: Diagnosis not present

## 2018-12-09 DIAGNOSIS — D1801 Hemangioma of skin and subcutaneous tissue: Secondary | ICD-10-CM | POA: Diagnosis not present

## 2018-12-09 DIAGNOSIS — D225 Melanocytic nevi of trunk: Secondary | ICD-10-CM | POA: Diagnosis not present

## 2018-12-09 DIAGNOSIS — L821 Other seborrheic keratosis: Secondary | ICD-10-CM | POA: Diagnosis not present

## 2018-12-09 DIAGNOSIS — L918 Other hypertrophic disorders of the skin: Secondary | ICD-10-CM | POA: Diagnosis not present

## 2018-12-09 DIAGNOSIS — D2271 Melanocytic nevi of right lower limb, including hip: Secondary | ICD-10-CM | POA: Diagnosis not present

## 2018-12-09 DIAGNOSIS — D2272 Melanocytic nevi of left lower limb, including hip: Secondary | ICD-10-CM | POA: Diagnosis not present

## 2019-01-04 DIAGNOSIS — R69 Illness, unspecified: Secondary | ICD-10-CM | POA: Diagnosis not present

## 2019-01-04 DIAGNOSIS — F4312 Post-traumatic stress disorder, chronic: Secondary | ICD-10-CM | POA: Diagnosis not present

## 2019-01-04 DIAGNOSIS — F909 Attention-deficit hyperactivity disorder, unspecified type: Secondary | ICD-10-CM | POA: Diagnosis not present

## 2019-01-18 DIAGNOSIS — F4312 Post-traumatic stress disorder, chronic: Secondary | ICD-10-CM | POA: Diagnosis not present

## 2019-01-18 DIAGNOSIS — R69 Illness, unspecified: Secondary | ICD-10-CM | POA: Diagnosis not present

## 2019-01-18 DIAGNOSIS — F909 Attention-deficit hyperactivity disorder, unspecified type: Secondary | ICD-10-CM | POA: Diagnosis not present

## 2019-02-01 DIAGNOSIS — R69 Illness, unspecified: Secondary | ICD-10-CM | POA: Diagnosis not present

## 2019-02-01 DIAGNOSIS — F909 Attention-deficit hyperactivity disorder, unspecified type: Secondary | ICD-10-CM | POA: Diagnosis not present

## 2019-02-01 DIAGNOSIS — F4312 Post-traumatic stress disorder, chronic: Secondary | ICD-10-CM | POA: Diagnosis not present

## 2019-02-09 DIAGNOSIS — F4312 Post-traumatic stress disorder, chronic: Secondary | ICD-10-CM | POA: Diagnosis not present

## 2019-02-09 DIAGNOSIS — F909 Attention-deficit hyperactivity disorder, unspecified type: Secondary | ICD-10-CM | POA: Diagnosis not present

## 2019-02-09 DIAGNOSIS — R69 Illness, unspecified: Secondary | ICD-10-CM | POA: Diagnosis not present

## 2019-02-15 DIAGNOSIS — F4312 Post-traumatic stress disorder, chronic: Secondary | ICD-10-CM | POA: Diagnosis not present

## 2019-02-15 DIAGNOSIS — R69 Illness, unspecified: Secondary | ICD-10-CM | POA: Diagnosis not present

## 2019-02-15 DIAGNOSIS — F909 Attention-deficit hyperactivity disorder, unspecified type: Secondary | ICD-10-CM | POA: Diagnosis not present

## 2019-03-01 DIAGNOSIS — F4312 Post-traumatic stress disorder, chronic: Secondary | ICD-10-CM | POA: Diagnosis not present

## 2019-03-01 DIAGNOSIS — F909 Attention-deficit hyperactivity disorder, unspecified type: Secondary | ICD-10-CM | POA: Diagnosis not present

## 2019-03-01 DIAGNOSIS — R69 Illness, unspecified: Secondary | ICD-10-CM | POA: Diagnosis not present

## 2019-03-15 DIAGNOSIS — F909 Attention-deficit hyperactivity disorder, unspecified type: Secondary | ICD-10-CM | POA: Diagnosis not present

## 2019-03-15 DIAGNOSIS — F4312 Post-traumatic stress disorder, chronic: Secondary | ICD-10-CM | POA: Diagnosis not present

## 2019-03-15 DIAGNOSIS — R69 Illness, unspecified: Secondary | ICD-10-CM | POA: Diagnosis not present

## 2019-03-18 DIAGNOSIS — N3000 Acute cystitis without hematuria: Secondary | ICD-10-CM | POA: Diagnosis not present

## 2019-03-27 DIAGNOSIS — R69 Illness, unspecified: Secondary | ICD-10-CM | POA: Diagnosis not present

## 2019-03-27 DIAGNOSIS — F909 Attention-deficit hyperactivity disorder, unspecified type: Secondary | ICD-10-CM | POA: Diagnosis not present

## 2019-03-27 DIAGNOSIS — F4312 Post-traumatic stress disorder, chronic: Secondary | ICD-10-CM | POA: Diagnosis not present

## 2019-04-04 ENCOUNTER — Other Ambulatory Visit: Payer: Self-pay

## 2019-04-04 ENCOUNTER — Encounter: Payer: Self-pay | Admitting: Obstetrics & Gynecology

## 2019-04-04 ENCOUNTER — Ambulatory Visit (INDEPENDENT_AMBULATORY_CARE_PROVIDER_SITE_OTHER): Payer: 59 | Admitting: Obstetrics & Gynecology

## 2019-04-04 VITALS — BP 122/80 | Ht 67.0 in | Wt 171.0 lb

## 2019-04-04 DIAGNOSIS — Z01419 Encounter for gynecological examination (general) (routine) without abnormal findings: Secondary | ICD-10-CM | POA: Diagnosis not present

## 2019-04-04 DIAGNOSIS — Z30431 Encounter for routine checking of intrauterine contraceptive device: Secondary | ICD-10-CM | POA: Diagnosis not present

## 2019-04-04 DIAGNOSIS — Z23 Encounter for immunization: Secondary | ICD-10-CM | POA: Diagnosis not present

## 2019-04-04 NOTE — Addendum Note (Signed)
Addended by: Lorine Bears on: 04/04/2019 03:39 PM   Modules accepted: Orders

## 2019-04-04 NOTE — Progress Notes (Signed)
Shannon Richardson Feb 27, 1981 [id number]   History:    38 y.o. G1P1L1 Married.  Son is 69 1/2 yo.  RP:  Established patient presenting for annual gyn exam   HPI: Well on Mirena IUD x 09/2015.  No menses on the IUD.  No pelvic pain.  Urine and bowel movements normal.  Breasts normal.  Body mass index 26.78.  Not very physically active.  No risk factor for cardiovascular disease.  Mother with Hypothyroidism.  Past medical history,surgical history, family history and social history were all reviewed and documented in the EPIC chart.  Gynecologic History No LMP recorded (lmp unknown). (Menstrual status: IUD). Contraception: Mirena IUD x 09/2015 Last Pap: 03/2017. Results were: Negative Last mammogram: Never Bone Density: Never Colonoscopy: Never  Obstetric History OB History  Gravida Para Term Preterm AB Living  1 1 1     1   SAB TAB Ectopic Multiple Live Births        0 1    # Outcome Date GA Lbr Len/2nd Weight Sex Delivery Anes PTL Lv  1 Term 07/19/15 [redacted]w[redacted]d 17:01 / 06:16 8 lb 11.2 oz (3.945 kg) M CS-LTranv EPI  LIV     ROS: A ROS was performed and pertinent positives and negatives are included in the history.  GENERAL: No fevers or chills. HEENT: No change in vision, no earache, sore throat or sinus congestion. NECK: No pain or stiffness. CARDIOVASCULAR: No chest pain or pressure. No palpitations. PULMONARY: No shortness of breath, cough or wheeze. GASTROINTESTINAL: No abdominal pain, nausea, vomiting or diarrhea, melena or bright red blood per rectum. GENITOURINARY: No urinary frequency, urgency, hesitancy or dysuria. MUSCULOSKELETAL: No joint or muscle pain, no back pain, no recent trauma. DERMATOLOGIC: No rash, no itching, no lesions. ENDOCRINE: No polyuria, polydipsia, no heat or cold intolerance. No recent change in weight. HEMATOLOGICAL: No anemia or easy bruising or bleeding. NEUROLOGIC: No headache, seizures, numbness, tingling or weakness. PSYCHIATRIC: No depression, no loss of  interest in normal activity or change in sleep pattern.     Exam:   BP 122/80 (BP Location: Right Arm, Patient Position: Sitting, Cuff Size: Normal)   Ht 5\' 7"  (1.702 m)   Wt 171 lb (77.6 kg)   LMP  (LMP Unknown)   BMI 26.78 kg/m   Body mass index is 26.78 kg/m.  General appearance : Well developed well nourished female. No acute distress HEENT: Eyes: no retinal hemorrhage or exudates,  Neck supple, trachea midline, no carotid bruits, no thyroidmegaly Lungs: Clear to auscultation, no rhonchi or wheezes, or rib retractions  Heart: Regular rate and rhythm, no murmurs or gallops Breast:Examined in sitting and supine position were symmetrical in appearance, no palpable masses or tenderness,  no skin retraction, no nipple inversion, no nipple discharge, no skin discoloration, no axillary or supraclavicular lymphadenopathy Abdomen: no palpable masses or tenderness, no rebound or guarding Extremities: no edema or skin discoloration or tenderness  Pelvic: Vulva: Normal             Vagina: No gross lesions or discharge  Cervix: No gross lesions or discharge.  IUD strings visible.  Pap reflex done.  Uterus  AV, normal size, shape and consistency, non-tender and mobile  Adnexa  Without masses or tenderness  Anus: Normal   Assessment/Plan:  38 y.o. female for annual exam   1. Encounter for routine gynecological examination with Papanicolaou smear of cervix Normal gynecologic exam.  Pap reflex done.  Breast exam normal.  Body mass index stable at 26.78.  Healthy nutrition.  Recommend increase physical activities with aerobic activities 5 times a week and weightlifting every 2 days.  2. Encounter for routine checking of intrauterine contraceptive device (IUD) Well on the Mirena IUD since May 2017.  IUD in good location with strings visible at the exocervix.  3. Needs flu shot Flu shot given.  Other orders - levonorgestrel (MIRENA) 20 MCG/24HR IUD; 1 each by Intrauterine route once.    Princess Bruins MD, 3:22 PM 04/04/2019

## 2019-04-04 NOTE — Patient Instructions (Signed)
1. Encounter for routine gynecological examination with Papanicolaou smear of cervix Normal gynecologic exam.  Pap reflex done.  Breast exam normal.  Body mass index stable at 26.78.  Healthy nutrition.  Recommend increase physical activities with aerobic activities 5 times a week and weightlifting every 2 days.  2. Encounter for routine checking of intrauterine contraceptive device (IUD) Well on the Mirena IUD since May 2017.  IUD in good location with strings visible at the exocervix.  3. Needs flu shot Flu shot given.  Other orders - levonorgestrel (MIRENA) 20 MCG/24HR IUD; 1 each by Intrauterine route once.  Shannon Richardson, it was a pleasure seeing you today!  I will inform you of your results as soon as they are available.

## 2019-04-07 LAB — PAP IG W/ RFLX HPV ASCU

## 2019-04-12 DIAGNOSIS — F909 Attention-deficit hyperactivity disorder, unspecified type: Secondary | ICD-10-CM | POA: Diagnosis not present

## 2019-04-12 DIAGNOSIS — R69 Illness, unspecified: Secondary | ICD-10-CM | POA: Diagnosis not present

## 2019-04-12 DIAGNOSIS — F4312 Post-traumatic stress disorder, chronic: Secondary | ICD-10-CM | POA: Diagnosis not present

## 2019-05-10 DIAGNOSIS — R69 Illness, unspecified: Secondary | ICD-10-CM | POA: Diagnosis not present

## 2019-05-10 DIAGNOSIS — F4312 Post-traumatic stress disorder, chronic: Secondary | ICD-10-CM | POA: Diagnosis not present

## 2019-05-10 DIAGNOSIS — F909 Attention-deficit hyperactivity disorder, unspecified type: Secondary | ICD-10-CM | POA: Diagnosis not present

## 2019-06-07 DIAGNOSIS — R69 Illness, unspecified: Secondary | ICD-10-CM | POA: Diagnosis not present

## 2019-06-07 DIAGNOSIS — F4312 Post-traumatic stress disorder, chronic: Secondary | ICD-10-CM | POA: Diagnosis not present

## 2019-06-07 DIAGNOSIS — F909 Attention-deficit hyperactivity disorder, unspecified type: Secondary | ICD-10-CM | POA: Diagnosis not present

## 2019-06-14 DIAGNOSIS — Z85820 Personal history of malignant melanoma of skin: Secondary | ICD-10-CM | POA: Diagnosis not present

## 2019-06-14 DIAGNOSIS — D485 Neoplasm of uncertain behavior of skin: Secondary | ICD-10-CM | POA: Diagnosis not present

## 2019-06-14 DIAGNOSIS — D2271 Melanocytic nevi of right lower limb, including hip: Secondary | ICD-10-CM | POA: Diagnosis not present

## 2019-06-14 DIAGNOSIS — D225 Melanocytic nevi of trunk: Secondary | ICD-10-CM | POA: Diagnosis not present

## 2019-06-14 DIAGNOSIS — D2239 Melanocytic nevi of other parts of face: Secondary | ICD-10-CM | POA: Diagnosis not present

## 2019-06-14 DIAGNOSIS — L820 Inflamed seborrheic keratosis: Secondary | ICD-10-CM | POA: Diagnosis not present

## 2019-06-14 DIAGNOSIS — Z85828 Personal history of other malignant neoplasm of skin: Secondary | ICD-10-CM | POA: Diagnosis not present

## 2019-06-14 DIAGNOSIS — D2261 Melanocytic nevi of right upper limb, including shoulder: Secondary | ICD-10-CM | POA: Diagnosis not present

## 2019-06-14 DIAGNOSIS — L918 Other hypertrophic disorders of the skin: Secondary | ICD-10-CM | POA: Diagnosis not present

## 2019-07-05 DIAGNOSIS — R69 Illness, unspecified: Secondary | ICD-10-CM | POA: Diagnosis not present

## 2019-07-05 DIAGNOSIS — F4312 Post-traumatic stress disorder, chronic: Secondary | ICD-10-CM | POA: Diagnosis not present

## 2019-07-05 DIAGNOSIS — F909 Attention-deficit hyperactivity disorder, unspecified type: Secondary | ICD-10-CM | POA: Diagnosis not present

## 2019-07-27 DIAGNOSIS — R69 Illness, unspecified: Secondary | ICD-10-CM | POA: Diagnosis not present

## 2019-07-27 DIAGNOSIS — F4312 Post-traumatic stress disorder, chronic: Secondary | ICD-10-CM | POA: Diagnosis not present

## 2019-07-27 DIAGNOSIS — F909 Attention-deficit hyperactivity disorder, unspecified type: Secondary | ICD-10-CM | POA: Diagnosis not present

## 2019-08-02 DIAGNOSIS — R69 Illness, unspecified: Secondary | ICD-10-CM | POA: Diagnosis not present

## 2019-08-02 DIAGNOSIS — F4312 Post-traumatic stress disorder, chronic: Secondary | ICD-10-CM | POA: Diagnosis not present

## 2019-08-02 DIAGNOSIS — F909 Attention-deficit hyperactivity disorder, unspecified type: Secondary | ICD-10-CM | POA: Diagnosis not present

## 2019-08-30 DIAGNOSIS — F909 Attention-deficit hyperactivity disorder, unspecified type: Secondary | ICD-10-CM | POA: Diagnosis not present

## 2019-08-30 DIAGNOSIS — R69 Illness, unspecified: Secondary | ICD-10-CM | POA: Diagnosis not present

## 2019-08-30 DIAGNOSIS — F4312 Post-traumatic stress disorder, chronic: Secondary | ICD-10-CM | POA: Diagnosis not present

## 2019-09-27 DIAGNOSIS — F909 Attention-deficit hyperactivity disorder, unspecified type: Secondary | ICD-10-CM | POA: Diagnosis not present

## 2019-09-27 DIAGNOSIS — R69 Illness, unspecified: Secondary | ICD-10-CM | POA: Diagnosis not present

## 2019-09-27 DIAGNOSIS — F4312 Post-traumatic stress disorder, chronic: Secondary | ICD-10-CM | POA: Diagnosis not present

## 2019-10-25 DIAGNOSIS — F909 Attention-deficit hyperactivity disorder, unspecified type: Secondary | ICD-10-CM | POA: Diagnosis not present

## 2019-10-25 DIAGNOSIS — F4312 Post-traumatic stress disorder, chronic: Secondary | ICD-10-CM | POA: Diagnosis not present

## 2019-10-25 DIAGNOSIS — R69 Illness, unspecified: Secondary | ICD-10-CM | POA: Diagnosis not present

## 2019-12-06 DIAGNOSIS — F4312 Post-traumatic stress disorder, chronic: Secondary | ICD-10-CM | POA: Diagnosis not present

## 2019-12-06 DIAGNOSIS — F909 Attention-deficit hyperactivity disorder, unspecified type: Secondary | ICD-10-CM | POA: Diagnosis not present

## 2019-12-06 DIAGNOSIS — R69 Illness, unspecified: Secondary | ICD-10-CM | POA: Diagnosis not present

## 2019-12-07 DIAGNOSIS — R69 Illness, unspecified: Secondary | ICD-10-CM | POA: Diagnosis not present

## 2019-12-07 DIAGNOSIS — F4312 Post-traumatic stress disorder, chronic: Secondary | ICD-10-CM | POA: Diagnosis not present

## 2019-12-07 DIAGNOSIS — F909 Attention-deficit hyperactivity disorder, unspecified type: Secondary | ICD-10-CM | POA: Diagnosis not present

## 2019-12-20 DIAGNOSIS — D1801 Hemangioma of skin and subcutaneous tissue: Secondary | ICD-10-CM | POA: Diagnosis not present

## 2019-12-20 DIAGNOSIS — Z85820 Personal history of malignant melanoma of skin: Secondary | ICD-10-CM | POA: Diagnosis not present

## 2019-12-20 DIAGNOSIS — D2271 Melanocytic nevi of right lower limb, including hip: Secondary | ICD-10-CM | POA: Diagnosis not present

## 2019-12-20 DIAGNOSIS — D2262 Melanocytic nevi of left upper limb, including shoulder: Secondary | ICD-10-CM | POA: Diagnosis not present

## 2019-12-20 DIAGNOSIS — D225 Melanocytic nevi of trunk: Secondary | ICD-10-CM | POA: Diagnosis not present

## 2019-12-20 DIAGNOSIS — L821 Other seborrheic keratosis: Secondary | ICD-10-CM | POA: Diagnosis not present

## 2019-12-20 DIAGNOSIS — L918 Other hypertrophic disorders of the skin: Secondary | ICD-10-CM | POA: Diagnosis not present

## 2019-12-20 DIAGNOSIS — Z85828 Personal history of other malignant neoplasm of skin: Secondary | ICD-10-CM | POA: Diagnosis not present

## 2019-12-20 DIAGNOSIS — L700 Acne vulgaris: Secondary | ICD-10-CM | POA: Diagnosis not present

## 2020-01-03 DIAGNOSIS — F909 Attention-deficit hyperactivity disorder, unspecified type: Secondary | ICD-10-CM | POA: Diagnosis not present

## 2020-01-03 DIAGNOSIS — R69 Illness, unspecified: Secondary | ICD-10-CM | POA: Diagnosis not present

## 2020-01-03 DIAGNOSIS — F4312 Post-traumatic stress disorder, chronic: Secondary | ICD-10-CM | POA: Diagnosis not present

## 2020-01-31 DIAGNOSIS — F4011 Social phobia, generalized: Secondary | ICD-10-CM | POA: Diagnosis not present

## 2020-01-31 DIAGNOSIS — R69 Illness, unspecified: Secondary | ICD-10-CM | POA: Diagnosis not present

## 2020-01-31 DIAGNOSIS — F909 Attention-deficit hyperactivity disorder, unspecified type: Secondary | ICD-10-CM | POA: Diagnosis not present

## 2020-02-20 DIAGNOSIS — F4312 Post-traumatic stress disorder, chronic: Secondary | ICD-10-CM | POA: Diagnosis not present

## 2020-02-20 DIAGNOSIS — F4011 Social phobia, generalized: Secondary | ICD-10-CM | POA: Diagnosis not present

## 2020-02-20 DIAGNOSIS — R69 Illness, unspecified: Secondary | ICD-10-CM | POA: Diagnosis not present

## 2020-04-18 DIAGNOSIS — F4312 Post-traumatic stress disorder, chronic: Secondary | ICD-10-CM | POA: Diagnosis not present

## 2020-04-18 DIAGNOSIS — R69 Illness, unspecified: Secondary | ICD-10-CM | POA: Diagnosis not present

## 2020-04-18 DIAGNOSIS — F4011 Social phobia, generalized: Secondary | ICD-10-CM | POA: Diagnosis not present

## 2020-06-25 DIAGNOSIS — F4011 Social phobia, generalized: Secondary | ICD-10-CM | POA: Diagnosis not present

## 2020-06-25 DIAGNOSIS — R69 Illness, unspecified: Secondary | ICD-10-CM | POA: Diagnosis not present

## 2020-06-28 DIAGNOSIS — F4312 Post-traumatic stress disorder, chronic: Secondary | ICD-10-CM | POA: Diagnosis not present

## 2020-06-28 DIAGNOSIS — F909 Attention-deficit hyperactivity disorder, unspecified type: Secondary | ICD-10-CM | POA: Diagnosis not present

## 2020-06-28 DIAGNOSIS — R69 Illness, unspecified: Secondary | ICD-10-CM | POA: Diagnosis not present

## 2020-10-11 ENCOUNTER — Encounter: Payer: Self-pay | Admitting: Obstetrics & Gynecology

## 2020-10-11 ENCOUNTER — Ambulatory Visit (INDEPENDENT_AMBULATORY_CARE_PROVIDER_SITE_OTHER): Payer: BC Managed Care – PPO | Admitting: Obstetrics & Gynecology

## 2020-10-11 ENCOUNTER — Ambulatory Visit: Payer: Self-pay | Admitting: Obstetrics & Gynecology

## 2020-10-11 ENCOUNTER — Other Ambulatory Visit: Payer: Self-pay

## 2020-10-11 VITALS — BP 118/70 | Ht 66.25 in | Wt 190.2 lb

## 2020-10-11 DIAGNOSIS — Z01419 Encounter for gynecological examination (general) (routine) without abnormal findings: Secondary | ICD-10-CM

## 2020-10-11 DIAGNOSIS — R635 Abnormal weight gain: Secondary | ICD-10-CM | POA: Diagnosis not present

## 2020-10-11 DIAGNOSIS — Z30431 Encounter for routine checking of intrauterine contraceptive device: Secondary | ICD-10-CM

## 2020-10-11 DIAGNOSIS — Z6830 Body mass index (BMI) 30.0-30.9, adult: Secondary | ICD-10-CM

## 2020-10-11 DIAGNOSIS — E6609 Other obesity due to excess calories: Secondary | ICD-10-CM | POA: Diagnosis not present

## 2020-10-11 NOTE — Progress Notes (Signed)
Shannon Richardson Feb 22, 1981 [id number]   History:    40 y.o.  G1P1L1 Married.  Son is 64 yo.  RP:  Established patient presenting for annual gyn exam   HPI: Well on Mirena IUD x 09/2015.No menses on the IUD. No pelvic pain. Urine and bowel movements normal. Breasts normal. Body mass index increased to 30.47. Not very physically active. No risk factor for cardiovascular disease. Mother with Hypothyroidism.  Past medical history,surgical history, family history and social history were all reviewed and documented in the EPIC chart.  Gynecologic History No LMP recorded. (Menstrual status: IUD).  Obstetric History OB History  Gravida Para Term Preterm AB Living  1 1 1     1   SAB IAB Ectopic Multiple Live Births        0 1    # Outcome Date GA Lbr Len/2nd Weight Sex Delivery Anes PTL Lv  1 Term 07/19/15 [redacted]w[redacted]d 17:01 / 06:16 8 lb 11.2 oz (3.945 kg) M CS-LTranv EPI  LIV     ROS: A ROS was performed and pertinent positives and negatives are included in the history.  GENERAL: No fevers or chills. HEENT: No change in vision, no earache, sore throat or sinus congestion. NECK: No pain or stiffness. CARDIOVASCULAR: No chest pain or pressure. No palpitations. PULMONARY: No shortness of breath, cough or wheeze. GASTROINTESTINAL: No abdominal pain, nausea, vomiting or diarrhea, melena or bright red blood per rectum. GENITOURINARY: No urinary frequency, urgency, hesitancy or dysuria. MUSCULOSKELETAL: No joint or muscle pain, no back pain, no recent trauma. DERMATOLOGIC: No rash, no itching, no lesions. ENDOCRINE: No polyuria, polydipsia, no heat or cold intolerance. No recent change in weight. HEMATOLOGICAL: No anemia or easy bruising or bleeding. NEUROLOGIC: No headache, seizures, numbness, tingling or weakness. PSYCHIATRIC: No depression, no loss of interest in normal activity or change in sleep pattern.     Exam:   BP 118/70   Ht 5' 6.25" (1.683 m)   Wt 190 lb 3.2 oz (86.3 kg)   BMI  30.47 kg/m   Body mass index is 30.47 kg/m.  General appearance : Well developed well nourished female. No acute distress HEENT: Eyes: no retinal hemorrhage or exudates,  Neck supple, trachea midline, no carotid bruits, no thyroidmegaly Lungs: Clear to auscultation, no rhonchi or wheezes, or rib retractions  Heart: Regular rate and rhythm, no murmurs or gallops Breast:Examined in sitting and supine position were symmetrical in appearance, no palpable masses or tenderness,  no skin retraction, no nipple inversion, no nipple discharge, no skin discoloration, no axillary or supraclavicular lymphadenopathy Abdomen: no palpable masses or tenderness, no rebound or guarding Extremities: no edema or skin discoloration or tenderness  Pelvic: Vulva: Normal             Vagina: No gross lesions or discharge  Cervix: No gross lesions or discharge.  IUD strings felt at [facility].  Uterus  AV, normal size, shape and consistency, non-tender and mobile  Adnexa  Without masses or tenderness  Anus: Normal   Assessment/Plan:  40 y.o. female for annual exam   1. Well female exam with routine gynecological exam Normal gynecologic exam.  Last Pap test negative in November 2020, no indication to repeat this year.  Breast exam normal.  2. Encounter for routine checking of intrauterine contraceptive device (IUD) Well on Mirena IUD since May 2017.  Time to change IUD.  IUD is in good location with strings felt at the external os.  Patient will follow up for removal and  insertion of a new Mirena IUD. - IUD Insertion; Future  3. Abnormal weight gain We will do a full thyroid panel today given abnormal weight gain. - Thyroid Panel With TSH  4. Class 1 obesity due to excess calories without serious comorbidity with body mass index (BMI) of 30.0 to 30.9 in adult Rule out hypothyroidism with thyroid panel done today.  If thyroid function is normal, recommend a lower calorie/carb diet.  Aerobic activities 5 times a week  and light weightlifting every 2 days.  Princess Bruins MD, 1:49 PM 10/11/2020

## 2020-10-12 LAB — THYROID PANEL WITH TSH
Free Thyroxine Index: 2.1 (ref 1.4–3.8)
T3 Uptake: 29 % (ref 22–35)
T4, Total: 7.4 ug/dL (ref 5.1–11.9)
TSH: 1.12 mIU/L

## 2020-10-15 ENCOUNTER — Encounter: Payer: Self-pay | Admitting: Obstetrics & Gynecology

## 2020-10-31 DIAGNOSIS — F4011 Social phobia, generalized: Secondary | ICD-10-CM | POA: Diagnosis not present

## 2020-10-31 DIAGNOSIS — F4312 Post-traumatic stress disorder, chronic: Secondary | ICD-10-CM | POA: Diagnosis not present

## 2020-10-31 DIAGNOSIS — F909 Attention-deficit hyperactivity disorder, unspecified type: Secondary | ICD-10-CM | POA: Diagnosis not present

## 2020-11-05 DIAGNOSIS — R0602 Shortness of breath: Secondary | ICD-10-CM | POA: Diagnosis not present

## 2020-11-05 DIAGNOSIS — Z3200 Encounter for pregnancy test, result unknown: Secondary | ICD-10-CM | POA: Diagnosis not present

## 2020-11-05 DIAGNOSIS — Z6829 Body mass index (BMI) 29.0-29.9, adult: Secondary | ICD-10-CM | POA: Diagnosis not present

## 2020-11-05 DIAGNOSIS — R635 Abnormal weight gain: Secondary | ICD-10-CM | POA: Diagnosis not present

## 2020-11-08 ENCOUNTER — Ambulatory Visit (INDEPENDENT_AMBULATORY_CARE_PROVIDER_SITE_OTHER): Payer: BC Managed Care – PPO | Admitting: Obstetrics & Gynecology

## 2020-11-08 ENCOUNTER — Encounter: Payer: Self-pay | Admitting: Obstetrics & Gynecology

## 2020-11-08 ENCOUNTER — Other Ambulatory Visit: Payer: Self-pay

## 2020-11-08 VITALS — BP 118/70 | HR 80 | Resp 16

## 2020-11-08 DIAGNOSIS — Z30431 Encounter for routine checking of intrauterine contraceptive device: Secondary | ICD-10-CM

## 2020-11-08 DIAGNOSIS — Z30433 Encounter for removal and reinsertion of intrauterine contraceptive device: Secondary | ICD-10-CM | POA: Diagnosis not present

## 2020-11-08 NOTE — Progress Notes (Signed)
    Shannon Richardson [birth year] [id number]        40 y.o.  G1P1001   RP: Time for IUD removal and insertion of a new Mirena IUD.  HPI: Shannon Richardson a IUD since May 2017.  No breakthrough bleeding.  No pelvic pain.  Normal vaginal secretions.   OB History  Gravida Para Term Preterm AB Living  1 1 1     1   SAB IAB Ectopic Multiple Live Births        0 1    # Outcome Date GA Lbr Len/2nd Weight Sex Delivery Anes PTL Lv  1 Term 07/19/15 [redacted]w[redacted]d 17:01 / 06:16 8 lb 11.2 oz (3.945 kg) M CS-LTranv EPI  LIV    Past medical history,surgical history, problem list, medications, allergies, family history and social history were all reviewed and documented in the EPIC chart.   Directed ROS with pertinent positives and negatives documented in the history of present illness/assessment and plan.  Exam:  Vitals:   11/08/20 1016  BP: 118/70  Pulse: 80  Resp: 16   General appearance:  Normal                                                                    IUD procedure note       Patient presented to the office today for removal and placement of Mirena IUD. The patient had previously been provided with literature information on this method of contraception. The risks benefits and pros and cons were discussed and all her questions were answered. She is fully aware that this form of contraception is 99% effective and is good for 5-7 years.  Pelvic exam: Vulva normal Vagina: No lesions or discharge Cervix: No lesions or discharge.  Strings visible, grasped with a ring forceps and removed easily, complete and intact.  No Cx. Uterus: AV position Adnexa: No masses or tenderness Rectal exam: Not done  The cervix was cleansed with Betadine solution. Hurricane spray on the cervix.  A single-tooth tenaculum was placed on the anterior cervical lip. The IUD was shown to the patient and inserted in a sterile fashion.  Hysterometry with the IUD as being inserted was 7 cm.  The IUD string was trimmed. The  single-tooth tenaculum was removed. Patient was instructed to return back to the office in one month for follow up.         Assessment/Plan:  40 y.o. G1P1001   1. Encounter for IUD removal and reinsertion Easy removal and insertion of a new Mirena IUD.  Well-tolerated with no complication.  Postprocedure precautions reviewed.  Follow-up in 4 weeks for IUD check.  2. Encounter for routine checking of intrauterine contraceptive device (IUD) - IUD Insertion  Other orders - traZODone (DESYREL) 100 MG tablet; Take 100 mg by mouth at bedtime. - citalopram (CELEXA) 40 MG tablet; Take 40 mg by mouth daily. - amphetamine-dextroamphetamine (ADDERALL XR) 25 MG 24 hr capsule; Take by mouth every morning.   Shannon Richardson, 10:35 AM 11/08/2020

## 2020-12-06 ENCOUNTER — Encounter: Payer: Self-pay | Admitting: Obstetrics & Gynecology

## 2020-12-06 ENCOUNTER — Other Ambulatory Visit: Payer: Self-pay

## 2020-12-06 ENCOUNTER — Ambulatory Visit (INDEPENDENT_AMBULATORY_CARE_PROVIDER_SITE_OTHER): Payer: BC Managed Care – PPO | Admitting: Obstetrics & Gynecology

## 2020-12-06 VITALS — BP 104/64 | HR 87 | Resp 16

## 2020-12-06 DIAGNOSIS — Z30431 Encounter for routine checking of intrauterine contraceptive device: Secondary | ICD-10-CM | POA: Diagnosis not present

## 2020-12-06 NOTE — Progress Notes (Signed)
    Shannon Richardson Sep 29, 1980 [id number]        40 y.o.  G1P1001 Married.  RP: Post Mirena IUD insertion check  HPI: Well with new Mirena IUD x 11/08/2020.  No BTB.  No pelvic pain.  No vaginal d/c.  No fever.  Not sexually active x insertion.   OB History  Gravida Para Term Preterm AB Living  '1 1 1     1  '$ SAB IAB Ectopic Multiple Live Births        0 1    # Outcome Date GA Lbr Len/2nd Weight Sex Delivery Anes PTL Lv  1 Term 07/19/15 3w4d17:01 / 06:16 8 lb 11.2 oz (3.945 kg) M CS-LTranv EPI  LIV    Past medical history,surgical history, problem list, medications, allergies, family history and social history were all reviewed and documented in the EPIC chart.   Directed ROS with pertinent positives and negatives documented in the history of present illness/assessment and plan.  Exam:  Vitals:   12/06/20 1424  BP: 104/64  Pulse: 87  Resp: 16   General appearance:  Normal  Abdomen: Normal  Gynecologic exam: Vulva normal.  Speculum:  Cervix/Vagina normal.  IUD strings visible at EO.  No erythema.  Normal secretions.  No bleeding.   Assessment/Plan:  40y.o. G1P1001   1. Encounter for routine checking of intrauterine contraceptive device (IUD)  Mirena IUD well-tolerated since insertion on November 08, 2020.  No complication.  IUD in good position with no sign of infection.  Patient reassured.  Follow-up annual gynecologic exam June 2023.   MPrincess BruinsMD, 2:40 PM 12/06/2020

## 2020-12-26 DIAGNOSIS — D485 Neoplasm of uncertain behavior of skin: Secondary | ICD-10-CM | POA: Diagnosis not present

## 2020-12-26 DIAGNOSIS — D225 Melanocytic nevi of trunk: Secondary | ICD-10-CM | POA: Diagnosis not present

## 2021-01-24 ENCOUNTER — Other Ambulatory Visit: Payer: Self-pay

## 2021-01-24 ENCOUNTER — Encounter (HOSPITAL_BASED_OUTPATIENT_CLINIC_OR_DEPARTMENT_OTHER): Payer: Self-pay | Admitting: Nurse Practitioner

## 2021-01-24 ENCOUNTER — Ambulatory Visit (INDEPENDENT_AMBULATORY_CARE_PROVIDER_SITE_OTHER): Payer: BC Managed Care – PPO | Admitting: Nurse Practitioner

## 2021-01-24 VITALS — BP 103/62 | HR 72 | Ht 67.0 in | Wt 195.0 lb

## 2021-01-24 DIAGNOSIS — R5383 Other fatigue: Secondary | ICD-10-CM | POA: Diagnosis not present

## 2021-01-24 DIAGNOSIS — Z0000 Encounter for general adult medical examination without abnormal findings: Secondary | ICD-10-CM

## 2021-01-24 DIAGNOSIS — T7840XA Allergy, unspecified, initial encounter: Secondary | ICD-10-CM

## 2021-01-24 DIAGNOSIS — T17308A Unspecified foreign body in larynx causing other injury, initial encounter: Secondary | ICD-10-CM

## 2021-01-24 DIAGNOSIS — K5900 Constipation, unspecified: Secondary | ICD-10-CM | POA: Diagnosis not present

## 2021-01-24 DIAGNOSIS — R635 Abnormal weight gain: Secondary | ICD-10-CM | POA: Diagnosis not present

## 2021-01-24 DIAGNOSIS — C439 Malignant melanoma of skin, unspecified: Secondary | ICD-10-CM

## 2021-01-24 HISTORY — DX: Other fatigue: R53.83

## 2021-01-24 HISTORY — DX: Constipation, unspecified: K59.00

## 2021-01-24 HISTORY — DX: Encounter for general adult medical examination without abnormal findings: Z00.00

## 2021-01-24 NOTE — Assessment & Plan Note (Signed)
In the setting of weight gain and fatigue, consider possible thyroid etiology.  Increase water intake to a minimum of 64 ounces of water a day Increase physical activity to a minimum of 20 minutes every day Will check thyroid If not effective and thyroid normal, consider intermittent miralax use and referral to GI for evaluation.

## 2021-01-24 NOTE — Assessment & Plan Note (Signed)
No concerns today Monitored with derm

## 2021-01-24 NOTE — Assessment & Plan Note (Signed)
Choking while swallowing food in the setting of increased mucous production related to allergies and reflux symptoms.  Strongly suspect silent reflux present on consistent basis adding to issue Recommend famotidine and allergy medication daily to help reduce inflammation and esophageal swelling and irritation.  Chew food slowly and thoroughly before swallowing Ensure that liquids are consumed with foods and you maintain hydration Follow-up if symptoms are not improved with three weeks of consistent medication use- will consider GI referral for further evaluation if not effective.

## 2021-01-24 NOTE — Assessment & Plan Note (Signed)
CPE with labs today. Review of current and past medical history, social history, medication, and family history.  Review of care gaps and health maintenance recommendations.  Records from recent providers to be requested if not available in Chart Review or Care Everywhere.  Recommendations for health maintenance, diet, and exercise provided.

## 2021-01-24 NOTE — Assessment & Plan Note (Signed)
Recent increase in weight with no known changes in diet and exercise Possible hormonal etiology, but given other symptoms unable to rule out possible thyroid Will check labs today Recommend daily activity of at least 20 minutes, portioned meals with slow eating and water intake, stop eating when full, eat only when hungry, avoid late night snacking and high calorie empty foods such as soda and sweet treats Referral to healthy weight and wellness provided today Can consider medication options if needed.  Will make changes to plan of care based on labs

## 2021-01-24 NOTE — Assessment & Plan Note (Signed)
In the setting of weight gain and constipation, consider thyroid Encourage at least 7 hours of uninterrupted sleep per night Avoid television at least 30 minutes prior to bed Avoid caffeine after 3pm Activity of at least 20 minutes every day with well balanced diet and increased sunlight may be effective. Will monitor labs today

## 2021-01-24 NOTE — Patient Instructions (Signed)
Recommendations from today's visit: I recommend that you try an combination of allergy medication (zyrtec, allegra, claritin and famotidine , also called Pepcid) to help with the increased mucous production and inflammation in your esophagus. If you have tried this daily for about 2-3 weeks and see no difference, let me know and we can consider a GI consult to make sure that nothing is occurring in the esophagus to cause this.  I have sent the referral to healthy weight and wellness. They will call you to schedule If your labs show any abnormalities, we will let you know and make any changes that need to be made   Information on diet, exercise, and health maintenance recommendations are listed below. This is information to help you be sure you are on track for optimal health and monitoring.   Please look over this and let us know if you have any questions or if you have completed any of the health maintenance outside of [facility] so that we can be sure your records are up to date.  ___________________________________________________________  Thank you for choosing [facility] for your Primary Care needs. I am excited for the opportunity to partner with you to meet your health care goals. It was a pleasure meeting you today!  I am an Adult-Geriatric Nurse Practitioner with a background in caring for patients for more than 20 years. I provide primary care and sports medicine services to patients age 28 and older within this office. I am also the director of the APP Fellowship with [facility].   I am passionate about providing the best service to you through preventive medicine and supportive care. I consider you a part of the medical team and value your input. I work diligently to ensure that you are heard and your needs are met in a safe and effective manner. I want you to feel comfortable with me as your provider and want you to know that your health concerns are  important to me.  For your information, our office hours are Monday- Friday 8:00 AM - 5:00 PM At this time I am not in the office on Wednesdays.  If you have questions or concerns, please call our office at [phone] or send [place] a MyChart message and we will respond as quickly as possible.   For all urgent or time sensitive needs we ask that you please call the office to avoid delays. MyChart is not constantly monitored and replies may take up to 72 business hours.  MyChart Policy: MyChart allows for you to see your visit notes, after visit summary, provider recommendations, lab and tests results, make an appointment, request refills, and contact your provider or the office for non-urgent questions or concerns. Providers are seeing patients during normal business hours and do not have built in time to review MyChart messages.  We ask that you allow a minimum of 4 business days for responses to [employer]. For this reason, please do not send urgent requests through [place]. Please call the office at [phone]. Complex MyChart concerns may require a visit. Your provider may request you schedule a virtual or in person visit to ensure we are providing the best care possible. MyChart messages sent after 4:00 PM on Friday will not be received by the provider until Monday morning.    Lab and Test Results: You will receive your lab and test results on MyChart as soon as they are completed and results have been sent by the lab  or testing facility. Due to this service, you will receive your results BEFORE your provider.  I review lab and tests results each morning prior to seeing patients. Some results require collaboration with other providers to ensure you are receiving the most appropriate care. For this reason, we ask that you please allow a minimum of 4 business days for your provider to receive and review lab and test results and contact you about these.  Most lab and test result comments  from the provider will be sent through [place]. Your provider may recommend changes to the plan of care, follow-up visits, repeat testing, ask questions, or request an office visit to discuss these results. You may reply directly to this message or call the office at [phone] to provide information for the provider or set up an appointment. In some instances, you will be called with test results and recommendations. Please let us know if this is preferred and we will make note of this in your chart to provide this for you.    If you have not heard a response to your lab or test results in 72 business hours, please call the office to let us know.   After Hours: For all non-emergency after hours needs, please call the office at [phone] and select the option to reach the on-call provider service. On-call services are shared between multiple [facility] offices and therefore it will not be possible to speak directly with your provider. On-call providers may provide medical advice and recommendations, but are unable to provide refills for maintenance medications.  For all emergency or urgent medical needs after normal business hours, we recommend that you seek care at the closest Urgent Care or Emergency Department to ensure appropriate treatment in a timely manner.  [facility] [place] has a 24 hour emergency room located on the ground floor for your convenience.    Please do not hesitate to reach out to [place] with concerns.   Thank you, again, for choosing me as your health care partner. I appreciate your trust and look forward to learning more about you.   Worthy Keeler, DNP, AGNP-c ___________________________________________________________  Health Maintenance Recommendations Screening Testing Mammogram Every 1 -2 years based on history and risk factors Starting at age 44 Pap Smear Ages 21-39 every 3 years Ages 25-65 every 5 years with HPV testing More frequent testing  may be required based on results and history Colon Cancer Screening Every 1-10 years based on test performed, risk factors, and history Starting at age 6 Bone Density Screening Every 2-10 years based on history Starting at age 76 for women Recommendations for men differ based on medication usage, history, and risk factors AAA Screening One time ultrasound Men 19-9 years old who have every smoked Lung Cancer Screening Low Dose Lung CT every 12 months Age 63-80 years with a 30 pack-year smoking history who still smoke or who have quit within the last 15 years  Screening Labs Routine  Labs: Complete Blood Count (CBC), Complete Metabolic Panel (CMP), Cholesterol (Lipid Panel) Every 6-12 months based on history and medications May be recommended more frequently based on current conditions or previous results Hemoglobin A1c Lab Every 3-12 months based on history and previous results Starting at age 72 or earlier with diagnosis of diabetes, high cholesterol, BMI >26, and/or risk factors Frequent monitoring for patients with diabetes to ensure blood sugar control Thyroid Panel (TSH w/ T3 & T4) Every 6 months based on history, symptoms, and risk factors May be  repeated more often if on medication HIV One time testing for all patients 13 and older May be repeated more frequently for patients with increased risk factors or exposure Hepatitis C One time testing for all patients 54 and older May be repeated more frequently for patients with increased risk factors or exposure Gonorrhea, Chlamydia Every 12 months for all sexually active persons 13-24 years Additional monitoring may be recommended for those who are considered high risk or who have symptoms PSA Men 11-57 years old with risk factors Additional screening may be recommended from age 62-69 based on risk factors, symptoms, and history  Vaccine Recommendations Tetanus Booster All adults every 10 years Flu Vaccine All patients 6  months and older every year COVID Vaccine All patients 12 years and older Initial dosing with booster May recommend additional booster based on age and health history HPV Vaccine 2 doses all patients age 53-26 Dosing may be considered for patients over 26 Shingles Vaccine (Shingrix) 2 doses all adults 72 years and older Pneumonia (Pneumovax 23) All adults 25 years and older May recommend earlier dosing based on health history Pneumonia (Prevnar 33) All adults 25 years and older Dosed 1 year after Pneumovax 23  Additional Screening, Testing, and Vaccinations may be recommended on an individualized basis based on family history, health history, risk factors, and/or exposure.  __________________________________________________________  Diet Recommendations for All Patients  I recommend that all patients maintain a diet low in saturated fats, carbohydrates, and cholesterol. While this can be challenging at first, it is not impossible and small changes can make big differences.  Things to try: Decreasing the amount of soda, sweet tea, and/or juice to one or less per day and replace with water While water is always the first choice, if you do not like water you may consider adding a water additive without sugar to improve the taste other sugar free drinks Replace potatoes with a brightly colored vegetable at dinner Use healthy oils, such as canola oil or olive oil, instead of butter or hard margarine Limit your bread intake to two pieces or less a day Replace regular pasta with low carb pasta options Bake, broil, or grill foods instead of frying Monitor portion sizes  Eat smaller, more frequent meals throughout the day instead of large meals  An important thing to remember is, if you love foods that are not great for your health, you don't have to give them up completely. Instead, allow these foods to be a reward when you have done well. Allowing yourself to still have special treats  every once in a while is a nice way to tell yourself thank you for working hard to keep yourself healthy.   Also remember that every day is a new day. If you have a bad day and "fall off the wagon", you can still climb right back up and keep moving along on your journey!  We have resources available to help you!  Some websites that may be helpful include: www.http://carter.biz/  Www.VeryWellFit.com _____________________________________________________________  Activity Recommendations for All Patients  I recommend that all adults get at least 20 minutes of moderate physical activity that elevates your heart rate at least 5 days out of the week.  Some examples include: Walking or jogging at a pace that allows you to carry on a conversation Cycling (stationary bike or outdoors) Water aerobics Yoga Weight lifting Dancing If physical limitations prevent you from putting stress on your joints, exercise in a pool or seated in a chair are  excellent options.  Do determine your MAXIMUM heart rate for activity: YOUR AGE - 220 = MAX HeartRate   Remember! Do not push yourself too hard.  Start slowly and build up your pace, speed, weight, time in exercise, etc.  Allow your body to rest between exercise and get good sleep. You will need more water than normal when you are exerting yourself. Do not wait until you are thirsty to drink. Drink with a purpose of getting in at least 8, 8 ounce glasses of water a day plus more depending on how much you exercise and sweat.    If you begin to develop dizziness, chest pain, abdominal pain, jaw pain, shortness of breath, headache, vision changes, lightheadedness, or other concerning symptoms, stop the activity and allow your body to rest. If your symptoms are severe, seek emergency evaluation immediately. If your symptoms are concerning, but not severe, please let us know so that we can recommend further evaluation.    ________________________________________________________________

## 2021-01-24 NOTE — Assessment & Plan Note (Signed)
Suspect increased mucous production likely related to allergy and GERD symptoms causing intermittent choking.  Recommend daily use of antihistamine plus famotidine to help reduce inflammation and mucous production while keeping esophageal irritation to a minimum.  If this is not effective after 2-3 weeks of consistent use, consider GI evaluation. Patient will let me know if this is not effective.

## 2021-01-24 NOTE — Progress Notes (Signed)
Shannon Render, DNP, AGNP-c Primary Care & Sports Medicine [address]  [place] Richardson, Shannon Richardson [id number] [phone]  New patient visit   Patient: Shannon Richardson   DOB: 12/16/1980   40 y.o. Female  MRN: [id number] Visit Date: 01/24/2021  Patient Care Team: Shannon Richardson, Shannon Pesa, NP as PCP - General (Nurse Practitioner)  Today's healthcare provider: Orma Render, NP   Chief Complaint  Patient presents with   New Patient (Initial Visit)   Subjective    Shannon Richardson is a 40 y.o. female who presents today as a new patient to establish care.  HPI  Overall healthy 40 year old female to establish care. Concerns with weight gain and choking   Weight gain Endorses 25 lb weight gain over past few years with 15 lbs in last year No real changes in diet or exercise regimen aside from no longer carrying son who is too big to carry at this time She works from home on the computer [place] to the gym 1-2 times a week for cardio and weight training She started a ballet class last [place] [facility] [place] She reports either eating too much or skipping meals She tends to eat leftovers of her sons, which increases her intake She has tried weight loss in the past with a diet and workout program for 3 months where she lost about 5 lbs. She stopped the program out of frustration. She endorses constipation, fatigue, and easy bruising, as well.   Choking She endorses increased mucous production in her throat when her allergies are bad She states that this occasionally causes her to choke while swallowing and when she coughs up the food she notices that there is quite a bit of clear mucous present.  She states this used to happen once or twice a year but over the past few years she has noticed it increasing to about once a month She does have intermittent reflux symptoms, for which she takes tums as needed She does not consistently take zyrtec, but feels the  mucous issue is improved with this She denies a full feeling in her throat or sore throat   Past Medical History:  Diagnosis Date   Cancer ([place]) 2011   Melanoma   Hx of varicella    Postoperative state 07/19/2015   Postpartum care following cesarean delivery (3/10) 07/20/2015   Pregnancy induced hypertension    Past Surgical History:  Procedure Laterality Date   CESAREAN SECTION N/A 07/19/2015   Procedure: CESAREAN SECTION;  Surgeon: Shannon Bruins, MD;  Location: [facility];  Service: Obstetrics;  Laterality: N/A;   MELANOMA EXCISION     mirena iud     inserted 2017, removal & re-insertion 11-08-20   Family Status  Relation Name Status   Mother  (Not Specified)   Father  (Not Specified)   MGM  (Not Specified)   Family History  Problem Relation Age of Onset   Bipolar disorder Mother    Hypertension Father    COPD Maternal Grandmother    Cancer Maternal Grandmother        lung   Social History   Socioeconomic History   Marital status: Single    Spouse name: Not on file   Number of children: 1   Years of education: Not on file   Highest education level: Not on file  Occupational History   Not on file  Tobacco Use   Smoking status: Never   Smokeless tobacco: Never  Vaping Use   Vaping Use: Never used  Substance and Sexual Activity   Alcohol use: No   Drug use: No   Sexual activity: Not Currently    Partners: Male    Birth control/protection: I.U.D.    Comment: mirena inserted 11-08-20  Other Topics Concern   Not on file  Social History Narrative   Not on file   Social Determinants of Health   Financial Resource Strain: Not on file  Food Insecurity: Not on file  Transportation Needs: Not on file  Physical Activity: Not on file  Stress: Not on file  Social Connections: Not on file   Outpatient Medications Prior to Visit  Medication Sig   amphetamine-dextroamphetamine (ADDERALL XR) 25 MG 24 hr capsule Take by mouth every morning.   citalopram (CELEXA) 40  MG tablet Take 40 mg by mouth daily.   levonorgestrel (MIRENA) 20 MCG/24HR IUD 1 each by Intrauterine route once.   traZODone (DESYREL) 100 MG tablet Take 100 mg by mouth at bedtime.   No facility-administered medications prior to visit.   Allergies  Allergen Reactions   Adhesive [Tape] Rash    Immunization History  Administered Date(s) Administered   Influenza Inj Mdck Quad Pf 03/27/2019   Influenza,inj,Quad PF,6+ Mos 04/04/2019   Td 12/09/2005    Health Maintenance  Topic Date Due   COVID-19 Vaccine (1) Never done   Hepatitis C Screening  Never done   TETANUS/TDAP  12/10/2015   INFLUENZA VACCINE  02/24/2021 (Originally 12/09/2020)   PAP SMEAR-Modifier  04/03/2022   HIV Screening  Completed   HPV VACCINES  Aged Out    Patient Care Team: Shannon Richardson, Shannon Pesa, NP as PCP - General (Nurse Practitioner)  Review of Systems All review of systems negative except what is listed in the HPI   Objective    BP 103/62   Pulse 72   Ht '5\' 7"'$  (1.702 m)   Wt 195 lb (88.5 kg)   SpO2 99%   Breastfeeding No   BMI 30.54 kg/m  Physical Exam Vitals and nursing note reviewed.  Constitutional:      General: She is not in acute distress.    Appearance: Normal appearance.  HENT:     Head: Normocephalic and atraumatic.     Right Ear: Hearing, tympanic membrane, ear canal and external ear normal.     Left Ear: Hearing, tympanic membrane, ear canal and external ear normal.     Nose: Nose normal.     Right Sinus: No maxillary sinus tenderness or frontal sinus tenderness.     Left Sinus: No maxillary sinus tenderness or frontal sinus tenderness.     Mouth/Throat:     Lips: Pink.     Mouth: Mucous membranes are moist.     Pharynx: Oropharynx is clear.  Eyes:     General: Lids are normal. Vision grossly intact.     Extraocular Movements: Extraocular movements intact.     Conjunctiva/sclera: Conjunctivae normal.     Pupils: Pupils are equal, round, and reactive to light.     Funduscopic exam:     Right eye: Red reflex present.        Left eye: Red reflex present.    Visual Fields: Right eye visual fields normal and left eye visual fields normal.  Neck:     Thyroid: No thyromegaly.     Vascular: No carotid bruit.  Cardiovascular:     Rate and Rhythm: Normal rate and regular rhythm.     Chest Wall:  PMI is not displaced.     Pulses: Normal pulses.          Dorsalis pedis pulses are 2+ on the right side and 2+ on the left side.       Posterior tibial pulses are 2+ on the right side and 2+ on the left side.     Heart sounds: Normal heart sounds. No murmur heard. Pulmonary:     Effort: Pulmonary effort is normal. No respiratory distress.     Breath sounds: Normal breath sounds.  Abdominal:     General: Bowel sounds are normal. There is no distension.     Palpations: Abdomen is soft. There is no hepatomegaly, splenomegaly or mass.     Tenderness: There is no abdominal tenderness. There is no right CVA tenderness, left CVA tenderness, guarding or rebound.  Musculoskeletal:        General: Normal range of motion.     Cervical back: Full passive range of motion without pain and neck supple. No tenderness.     Right lower leg: No edema.     Left lower leg: No edema.  Feet:     Left foot:     Toenail Condition: Left toenails are normal.  Lymphadenopathy:     Cervical: No cervical adenopathy.     Upper Body:     Right upper body: No supraclavicular adenopathy.     Left upper body: No supraclavicular adenopathy.  Skin:    General: Skin is warm and dry.     Capillary Refill: Capillary refill takes less than 2 seconds.     Nails: There is no clubbing.  Neurological:     General: No focal deficit present.     Mental Status: She is alert and oriented to person, place, and time.     GCS: GCS eye subscore is 4. GCS verbal subscore is 5. GCS motor subscore is 6.     Cranial Nerves: Cranial nerves are intact.     Sensory: Sensation is intact.     Motor: Motor function is intact.      Coordination: Coordination is intact.     Gait: Gait is intact.     Deep Tendon Reflexes: Reflexes are normal and symmetric.  Psychiatric:        Attention and Perception: Attention normal.        Mood and Affect: Mood normal.        Speech: Speech normal.        Behavior: Behavior normal. Behavior is cooperative.        Thought Content: Thought content normal.        Cognition and Memory: Cognition and memory normal.        Judgment: Judgment normal.    Depression Screen PHQ 2/9 Scores 04/23/2015 03/12/2015  PHQ - 2 Score 0 0   No results found for any visits on 01/24/21.  Assessment & Plan      Problem List Items Addressed This Visit     Melanoma ([place])    No concerns today Monitored with derm      Encounter for medical examination to establish care - Primary    CPE with labs today. Review of current and past medical history, social history, medication, and family history.  Review of care gaps and health maintenance recommendations.  Records from recent providers to be requested if not available in Chart Review or Care Everywhere.  Recommendations for health maintenance, diet, and exercise provided.  Weight gain    Recent increase in weight with no known changes in diet and exercise Possible hormonal etiology, but given other symptoms unable to rule out possible thyroid Will check labs today Recommend daily activity of at least 20 minutes, portioned meals with slow eating and water intake, stop eating when full, eat only when hungry, avoid late night snacking and high calorie empty foods such as soda and sweet treats Referral to healthy weight and wellness provided today Can consider medication options if needed.  Will make changes to plan of care based on labs      Relevant Orders   CBC with Differential/Platelet   Comprehensive metabolic panel   Lipid panel   TSH   Amb Ref to Medical Weight Management   Fatigue    In the setting of weight gain and  constipation, consider thyroid Encourage at least 7 hours of uninterrupted sleep per night Avoid television at least 30 minutes prior to bed Avoid caffeine after 3pm Activity of at least 20 minutes every day with well balanced diet and increased sunlight may be effective. Will monitor labs today      Relevant Orders   CBC with Differential/Platelet   Comprehensive metabolic panel   Lipid panel   TSH   Amb Ref to Medical Weight Management   Constipation    In the setting of weight gain and fatigue, consider possible thyroid etiology.  Increase water intake to a minimum of 64 ounces of water a day Increase physical activity to a minimum of 20 minutes every day Will check thyroid If not effective and thyroid normal, consider intermittent miralax use and referral to GI for evaluation.       Relevant Orders   CBC with Differential/Platelet   Comprehensive metabolic panel   Lipid panel   TSH   Allergies    Suspect increased mucous production likely related to allergy and GERD symptoms causing intermittent choking.  Recommend daily use of antihistamine plus famotidine to help reduce inflammation and mucous production while keeping esophageal irritation to a minimum.  If this is not effective after 2-3 weeks of consistent use, consider GI evaluation. Patient will let me know if this is not effective.       Choking    Choking while swallowing food in the setting of increased mucous production related to allergies and reflux symptoms.  Strongly suspect silent reflux present on consistent basis adding to issue Recommend famotidine and allergy medication daily to help reduce inflammation and esophageal swelling and irritation.  Chew food slowly and thoroughly before swallowing Ensure that liquids are consumed with foods and you maintain hydration Follow-up if symptoms are not improved with three weeks of consistent medication use- will consider GI referral for further evaluation if not  effective.         Return in about 1 year (around 01/24/2022) for CPE today- CPE in 1 year.     Shannon Render, NP  [facility] [phone] (phone) [phone] (fax)  [place]

## 2021-01-25 LAB — CBC WITH DIFFERENTIAL/PLATELET
Basophils Absolute: 0 10*3/uL (ref 0.0–0.2)
Basos: 0 %
EOS (ABSOLUTE): 0.2 10*3/uL (ref 0.0–0.4)
Eos: 3 %
Hematocrit: 36.9 % (ref 34.0–46.6)
Hemoglobin: 12 g/dL (ref 11.1–15.9)
Immature Grans (Abs): 0 10*3/uL (ref 0.0–0.1)
Immature Granulocytes: 0 %
Lymphocytes Absolute: 2 10*3/uL (ref 0.7–3.1)
Lymphs: 28 %
MCH: 27.6 pg (ref 26.6–33.0)
MCHC: 32.5 g/dL (ref 31.5–35.7)
MCV: 85 fL (ref 79–97)
Monocytes Absolute: 0.5 10*3/uL (ref 0.1–0.9)
Monocytes: 7 %
Neutrophils Absolute: 4.3 10*3/uL (ref 1.4–7.0)
Neutrophils: 62 %
Platelets: 194 10*3/uL (ref 150–450)
RBC: 4.35 x10E6/uL (ref 3.77–5.28)
RDW: 13.1 % (ref 11.7–15.4)
WBC: 7 10*3/uL (ref 3.4–10.8)

## 2021-01-25 LAB — COMPREHENSIVE METABOLIC PANEL
ALT: 16 IU/L (ref 0–32)
AST: 19 IU/L (ref 0–40)
Albumin/Globulin Ratio: 1.7 (ref 1.2–2.2)
Albumin: 4.5 g/dL (ref 3.8–4.8)
Alkaline Phosphatase: 109 IU/L (ref 44–121)
BUN/Creatinine Ratio: 11 (ref 9–23)
BUN: 11 mg/dL (ref 6–20)
Bilirubin Total: 0.3 mg/dL (ref 0.0–1.2)
CO2: 26 mmol/L (ref 20–29)
Calcium: 9.3 mg/dL (ref 8.7–10.2)
Chloride: 102 mmol/L (ref 96–106)
Creatinine, Ser: 1.03 mg/dL — ABNORMAL HIGH (ref 0.57–1.00)
Globulin, Total: 2.7 g/dL (ref 1.5–4.5)
Glucose: 72 mg/dL (ref 65–99)
Potassium: 4.6 mmol/L (ref 3.5–5.2)
Sodium: 140 mmol/L (ref 134–144)
Total Protein: 7.2 g/dL (ref 6.0–8.5)
eGFR: 71 mL/min/{1.73_m2} (ref >59)

## 2021-01-25 LAB — LIPID PANEL
Chol/HDL Ratio: 2.9 ratio (ref 0.0–4.4)
Cholesterol, Total: 137 mg/dL (ref 100–199)
HDL: 48 mg/dL (ref >39)
LDL Chol Calc (NIH): 69 mg/dL (ref 0–99)
Triglycerides: 107 mg/dL (ref 0–149)
VLDL Cholesterol Cal: 20 mg/dL (ref 5–40)

## 2021-01-25 LAB — TSH: TSH: 1.19 u[IU]/mL (ref 0.450–4.500)

## 2021-01-27 NOTE — Progress Notes (Signed)
All lab results normal with exception of tiny bump in creatinine. This is likely due to activity levels and not concerning at this time- all other kidney function is normal. We will monitor this. No changes to plan of care.

## 2021-01-31 ENCOUNTER — Telehealth (HOSPITAL_BASED_OUTPATIENT_CLINIC_OR_DEPARTMENT_OTHER): Payer: Self-pay

## 2021-01-31 NOTE — Telephone Encounter (Signed)
Called patient to discuss lab results. Patient is aware and agreeable with results and recommendation. Instructed patient to contact the office with any questions or concerns.

## 2021-01-31 NOTE — Telephone Encounter (Signed)
-----   Message from Orma Render, NP sent at 01/27/2021  9:32 AM EDT ----- All lab results normal with exception of tiny bump in creatinine. This is likely due to activity levels and not concerning at this time- all other kidney function is normal. We will monitor this. No changes to plan of care.

## 2021-02-02 ENCOUNTER — Encounter (HOSPITAL_BASED_OUTPATIENT_CLINIC_OR_DEPARTMENT_OTHER): Payer: Self-pay | Admitting: Nurse Practitioner

## 2021-02-03 ENCOUNTER — Other Ambulatory Visit: Payer: Self-pay

## 2021-02-03 ENCOUNTER — Ambulatory Visit (INDEPENDENT_AMBULATORY_CARE_PROVIDER_SITE_OTHER): Payer: BC Managed Care – PPO | Admitting: Nurse Practitioner

## 2021-02-03 DIAGNOSIS — M545 Low back pain, unspecified: Secondary | ICD-10-CM

## 2021-02-03 DIAGNOSIS — R350 Frequency of micturition: Secondary | ICD-10-CM

## 2021-02-03 LAB — POCT URINALYSIS DIPSTICK
Bilirubin, UA: NEGATIVE
Glucose, UA: NEGATIVE
Ketones, UA: NEGATIVE
Leukocytes, UA: NEGATIVE
Nitrite, UA: NEGATIVE
Protein, UA: NEGATIVE
Spec Grav, UA: <=1.005 — AB (ref 1.010–1.025)
Urobilinogen, UA: 0.2 E.U./dL
pH, UA: 6.5 (ref 5.0–8.0)

## 2021-02-03 MED ORDER — TAMSULOSIN HCL 0.4 MG PO CAPS: 0.4000 mg | ORAL_CAPSULE | Freq: Every day | ORAL | 0 refills | Status: DC

## 2021-02-03 NOTE — Progress Notes (Addendum)
   Subjective:    Patient ID: Shannon Richardson, female    DOB: 12/16/1980, 40 y.o.   MRN: [id number]  HPI Patient presents with right sided lower back pain x 1 days and increased frequency and urge to urinate with minimal output x several days. Patients has a hx of kidney stones as well as kidney infections   Review of Systems     Objective:   Physical Exam        Assessment & Plan:   UA performed Discussed results and symptoms with Worthy Keeler, DNP  Per Worthy Keeler, DNP patient has the option to have [facility] imaging if pain is severe. Also advised patient start tamsulosin 0.4 mg for 30 days and follow up in 1 week. Patient has been advised that if pain level changes, she spikes a fever, or has any blood in her urine she needs to contact our office ASAP or head to the nearest emergency room.   Patient is aware and agreeable Orders placed  I have reviewed this encounter including the documentation in this note and/or discussed this patient with the [place]. All orders and medical decision making have been approved by the supervising primary care provider.   I am certifying that I agree with the content of this note as supervising primary care provider.   Orma Render, DNP, AGNP-c

## 2021-02-10 ENCOUNTER — Encounter (HOSPITAL_BASED_OUTPATIENT_CLINIC_OR_DEPARTMENT_OTHER): Payer: Self-pay | Admitting: Nurse Practitioner

## 2021-02-10 ENCOUNTER — Other Ambulatory Visit: Payer: Self-pay

## 2021-02-10 ENCOUNTER — Ambulatory Visit (INDEPENDENT_AMBULATORY_CARE_PROVIDER_SITE_OTHER): Payer: BC Managed Care – PPO | Admitting: Nurse Practitioner

## 2021-02-10 VITALS — BP 116/67 | HR 78 | Ht 67.0 in | Wt 196.8 lb

## 2021-02-10 DIAGNOSIS — N200 Calculus of kidney: Secondary | ICD-10-CM

## 2021-02-10 LAB — POCT URINALYSIS DIP (CLINITEK)
Bilirubin, UA: NEGATIVE
Glucose, UA: NEGATIVE mg/dL
Ketones, POC UA: NEGATIVE mg/dL
Leukocytes, UA: NEGATIVE
Nitrite, UA: NEGATIVE
POC PROTEIN,UA: NEGATIVE
Spec Grav, UA: 1.025 (ref 1.010–1.025)
Urobilinogen, UA: 0.2 E.U./dL
pH, UA: 7.5 (ref 5.0–8.0)

## 2021-02-10 NOTE — Assessment & Plan Note (Signed)
Positive kidney stones with two having passed in the last week Recommend that she continue the flomax for another 2-3 days to ensure that all stones have passed UA today only positive for trace blood, consistent with recent passed stone No antibiotic treatment warranted at this time No alarm symptoms present Patient will monitor for new or worsening symptoms and follow-up as needed.

## 2021-02-10 NOTE — Progress Notes (Signed)
Established Patient Office Visit  Subjective:  Patient ID: Shannon Richardson, female    DOB: Aug 11, 1980  Age: 40 y.o. MRN: [id number]  CC:  Chief Complaint  Patient presents with   Follow-up    Patient states she is here for a follow up after passing a kidney stone.    HPI Shannon Richardson presents for follow-up for kidney stone/uti symptoms from last week.  She reports that she has passed two stones with the most recent being yesterday.  Pain was manageable to OTC treatment with flomax.  No back pain, blood in urine, nausea, vomiting, diarrhea, pelvic pain, fever, chills.  She has a history of kidney stones about 10 years ago, but hasn't had any concerns in recent years. She does feel that she may have been a little dehydrated when the pain started.  She has not had previous stones analyzed.   Past Medical History:  Diagnosis Date   Cancer [facility]) 2011   Melanoma   Hx of varicella    Postoperative state 07/19/2015   Postpartum care following cesarean delivery (3/10) 07/20/2015   Pregnancy induced hypertension     Past Surgical History:  Procedure Laterality Date   CESAREAN SECTION N/A 07/19/2015   Procedure: CESAREAN SECTION;  Surgeon: Princess Bruins, MD;  Location: [facility];  Service: Obstetrics;  Laterality: N/A;   MELANOMA EXCISION     mirena iud     inserted 2017, removal & re-insertion 11-08-20    Family History  Problem Relation Age of Onset   Bipolar disorder Mother    Hypertension Father    COPD Maternal Grandmother    Cancer Maternal Grandmother        lung    Social History   Socioeconomic History   Marital status: Single    Spouse name: Not on file   Number of children: 1   Years of education: Not on file   Highest education level: Not on file  Occupational History   Not on file  Tobacco Use   Smoking status: Never   Smokeless tobacco: Never  Vaping Use   Vaping Use: Never used  Substance and Sexual Activity   Alcohol use: No   Drug use: No    Sexual activity: Not Currently    Partners: Male    Birth control/protection: I.U.D.    Comment: mirena inserted 11-08-20  Other Topics Concern   Not on file  Social History Narrative   Not on file   Social Determinants of Health   Financial Resource Strain: Not on file  Food Insecurity: Not on file  Transportation Needs: Not on file  Physical Activity: Not on file  Stress: Not on file  Social Connections: Not on file  Intimate Partner Violence: Not on file    Outpatient Medications Prior to Visit  Medication Sig Dispense Refill   amphetamine-dextroamphetamine (ADDERALL XR) 25 MG 24 hr capsule Take by mouth every morning.     citalopram (CELEXA) 40 MG tablet Take 40 mg by mouth daily.     levonorgestrel (MIRENA) 20 MCG/24HR IUD 1 each by Intrauterine route once.     tamsulosin (FLOMAX) 0.4 MG CAPS capsule Take 1 capsule (0.4 mg total) by mouth daily. 30 capsule 0   traZODone (DESYREL) 100 MG tablet Take 100 mg by mouth at bedtime.     No facility-administered medications prior to visit.    Allergies  Allergen Reactions   Adhesive [Tape] Rash    ROS Review of Systems All review of systems negative except  what is listed in the HPI    Objective:    Physical Exam Vitals and nursing note reviewed.  Constitutional:      Appearance: Normal appearance.  Eyes:     Extraocular Movements: Extraocular movements intact.     Conjunctiva/sclera: Conjunctivae normal.     Pupils: Pupils are equal, round, and reactive to light.  Cardiovascular:     Rate and Rhythm: Normal rate.  Pulmonary:     Effort: Pulmonary effort is normal.  Abdominal:     General: Abdomen is flat. Bowel sounds are normal. There is no distension.     Palpations: Abdomen is soft. There is no mass.     Tenderness: There is no abdominal tenderness. There is no right CVA tenderness, left CVA tenderness, guarding or rebound.  Musculoskeletal:        General: Normal range of motion.     Right lower leg: No  edema.     Left lower leg: No edema.  Skin:    General: Skin is warm and dry.     Capillary Refill: Capillary refill takes less than 2 seconds.  Neurological:     General: No focal deficit present.     Mental Status: She is alert and oriented to person, place, and time.  Psychiatric:        Mood and Affect: Mood normal.        Behavior: Behavior normal.        Thought Content: Thought content normal.        Judgment: Judgment normal.    BP 116/67   Pulse 78   Ht $R'5\' 7"'zz$  (1.702 m)   Wt 196 lb 12.8 oz (89.3 kg)   SpO2 98%   BMI 30.82 kg/m  Wt Readings from Last 3 Encounters:  02/10/21 196 lb 12.8 oz (89.3 kg)  01/24/21 195 lb (88.5 kg)  10/11/20 190 lb 3.2 oz (86.3 kg)     Health Maintenance Due  Topic Date Due   COVID-19 Vaccine (1) Never done   Hepatitis C Screening  Never done   TETANUS/TDAP  12/10/2015    There are no preventive care reminders to display for this patient.  Lab Results  Component Value Date   TSH 1.190 01/24/2021   Lab Results  Component Value Date   WBC 7.0 01/24/2021   HGB 12.0 01/24/2021   HCT 36.9 01/24/2021   MCV 85 01/24/2021   PLT 194 01/24/2021   Lab Results  Component Value Date   NA 140 01/24/2021   K 4.6 01/24/2021   CO2 26 01/24/2021   GLUCOSE 72 01/24/2021   BUN 11 01/24/2021   CREATININE 1.03 (H) 01/24/2021   BILITOT 0.3 01/24/2021   ALKPHOS 109 01/24/2021   AST 19 01/24/2021   ALT 16 01/24/2021   PROT 7.2 01/24/2021   ALBUMIN 4.5 01/24/2021   CALCIUM 9.3 01/24/2021   ANIONGAP 6 07/10/2015   EGFR 71 01/24/2021   GFR 73.80 08/10/2013   Lab Results  Component Value Date   CHOL 137 01/24/2021   Lab Results  Component Value Date   HDL 48 01/24/2021   Lab Results  Component Value Date   LDLCALC 69 01/24/2021   Lab Results  Component Value Date   TRIG 107 01/24/2021   Lab Results  Component Value Date   CHOLHDL 2.9 01/24/2021   No results found for: HGBA1C    Assessment & Plan:   Problem List Items  Addressed This Visit     Kidney stones -  Primary    Positive kidney stones with two having passed in the last week Recommend that she continue the flomax for another 2-3 days to ensure that all stones have passed UA today only positive for trace blood, consistent with recent passed stone No antibiotic treatment warranted at this time No alarm symptoms present Patient will monitor for new or worsening symptoms and follow-up as needed.       Relevant Orders   POCT URINALYSIS DIP (CLINITEK)    No orders of the defined types were placed in this encounter.   Follow-up: Return if symptoms worsen or fail to improve.    Orma Render, NP

## 2021-02-10 NOTE — Patient Instructions (Signed)
Recommendations from today's visit: If you would like to continue the flomax for a few more days just to ensure that you have passed all of the stones, that would be fine. You can save the rest in the event you have symptoms come up again. Be sure to stay well hydrated. If you pass stones in the future we can have them evaluated in the event you are concerned of what could be causing them.  If you develop any new symptoms, pain, fever, abdominal pain, please let me know.

## 2021-02-27 DIAGNOSIS — F4011 Social phobia, generalized: Secondary | ICD-10-CM | POA: Diagnosis not present

## 2021-02-27 DIAGNOSIS — F4312 Post-traumatic stress disorder, chronic: Secondary | ICD-10-CM | POA: Diagnosis not present

## 2021-02-27 DIAGNOSIS — F909 Attention-deficit hyperactivity disorder, unspecified type: Secondary | ICD-10-CM | POA: Diagnosis not present

## 2022-03-24 ENCOUNTER — Ambulatory Visit (INDEPENDENT_AMBULATORY_CARE_PROVIDER_SITE_OTHER): Payer: No Typology Code available for payment source | Admitting: Nurse Practitioner

## 2022-03-24 ENCOUNTER — Encounter (HOSPITAL_BASED_OUTPATIENT_CLINIC_OR_DEPARTMENT_OTHER): Payer: Self-pay | Admitting: Nurse Practitioner

## 2022-03-24 VITALS — BP 119/75 | HR 88 | Ht 66.0 in | Wt 195.0 lb

## 2022-03-24 DIAGNOSIS — J020 Streptococcal pharyngitis: Secondary | ICD-10-CM

## 2022-03-24 MED ORDER — AMOXICILLIN-POT CLAVULANATE 875-125 MG PO TABS: 1.0000 | ORAL_TABLET | Freq: Two times a day (BID) | ORAL | 0 refills | Status: DC

## 2022-03-24 NOTE — Progress Notes (Signed)
  Orma Render, DNP, AGNP-c Primary Care & [facility] Medicine [address]  [place], [place] [address] [phone]  Subjective:   Shannon Richardson is a 41 y.o. female presents to day for evaluation of: Sore Throat and Cough  Sore Throat  Shannon Richardson endorses sore throat with productive cough that has been present for the last couple of days. She is not sure if she is running a fever. She has been exposed to strep throat  PMH, Medications, and Allergies reviewed and updated in chart as appropriate.   ROS negative except for what is listed in HPI. Objective:  BP 119/75 (BP Location: Right Arm, Patient Position: Sitting)   Pulse 88   Ht '5\' 6"'$  (1.676 m)   Wt 195 lb (88.5 kg)   SpO2 100%   BMI 31.47 kg/m  Physical Exam Vitals and nursing note reviewed.  Constitutional:      Appearance: She is ill-appearing.  HENT:     Head: Normocephalic.     Right Ear: Tympanic membrane normal.     Left Ear: Tympanic membrane normal.     Nose: Congestion present.     Mouth/Throat:     Pharynx: Pharyngeal swelling, oropharyngeal exudate and posterior oropharyngeal erythema present.  Eyes:     Conjunctiva/sclera: Conjunctivae normal.  Cardiovascular:     Rate and Rhythm: Normal rate and regular rhythm.     Heart sounds: Normal heart sounds.  Pulmonary:     Effort: Pulmonary effort is normal.     Breath sounds: Normal breath sounds. No wheezing.  Abdominal:     Palpations: Abdomen is soft.  Musculoskeletal:     Cervical back: Normal range of motion.  Lymphadenopathy:     Cervical: Cervical adenopathy present.  Skin:    General: Skin is warm and dry.     Capillary Refill: Capillary refill takes less than 2 seconds.  Neurological:     General: No focal deficit present.     Mental Status: She is alert and oriented to person, place, and time.  Psychiatric:        Mood and Affect: Mood normal.        Behavior: Behavior normal.           Assessment & Plan:    Problem List Items Addressed This Visit     Streptococcal pharyngitis - Primary    Symptoms and presentation consistent with strep throat. Will treat with augmentin and supportive therapy.  If full resolution of symptoms has not recurred by 10 days recommend follow-up for repeat evaluation.  No alarm symptoms are present at this time.      Relevant Medications   amoxicillin-clavulanate (AUGMENTIN) 875-125 MG tablet      Orma Render, DNP, AGNP-c 05/08/2022  9:27 PM    History, Medications, Surgery, SDOH, and Family History reviewed and updated as appropriate.

## 2022-03-24 NOTE — Patient Instructions (Signed)
Alternate ibuprofen and tylenol every 4 hours for the pain and fever.  Rest and increase hydration.   If you are not feeling any better by next week (or even Friday), please let me know.   I will be moving to my new office on Monday, but you can still reach me through [place].  New office: [facility] [address] [phone]

## 2022-05-08 DIAGNOSIS — J020 Streptococcal pharyngitis: Secondary | ICD-10-CM

## 2022-05-08 HISTORY — DX: Streptococcal pharyngitis: J02.0

## 2022-05-08 NOTE — Assessment & Plan Note (Signed)
Symptoms and presentation consistent with strep throat. Will treat with augmentin and supportive therapy.  If full resolution of symptoms has not recurred by 10 days recommend follow-up for repeat evaluation.  No alarm symptoms are present at this time.

## 2023-11-09 ENCOUNTER — Other Ambulatory Visit: Payer: Self-pay | Admitting: Medical Genetics

## 2023-11-16 ENCOUNTER — Ambulatory Visit: Admitting: Nurse Practitioner

## 2023-11-16 ENCOUNTER — Other Ambulatory Visit (HOSPITAL_COMMUNITY)
Admission: RE | Admit: 2023-11-16 | Discharge: 2023-11-16 | Disposition: A | Discharge status: Home / Self Care | Payer: PRIVATE HEALTH INSURANCE | Source: Ambulatory Visit | Attending: Nurse Practitioner | Admitting: Nurse Practitioner

## 2023-11-16 ENCOUNTER — Encounter: Payer: Self-pay | Admitting: Nurse Practitioner

## 2023-11-16 VITALS — BP 122/74 | HR 78 | Ht 66.25 in | Wt 207.4 lb

## 2023-11-16 DIAGNOSIS — F4011 Social phobia, generalized: Secondary | ICD-10-CM

## 2023-11-16 DIAGNOSIS — R41840 Attention and concentration deficit: Secondary | ICD-10-CM

## 2023-11-16 DIAGNOSIS — Z0000 Encounter for general adult medical examination without abnormal findings: Secondary | ICD-10-CM | POA: Diagnosis not present

## 2023-11-16 DIAGNOSIS — Z6833 Body mass index (BMI) 33.0-33.9, adult: Secondary | ICD-10-CM

## 2023-11-16 DIAGNOSIS — T17308D Unspecified foreign body in larynx causing other injury, subsequent encounter: Secondary | ICD-10-CM

## 2023-11-16 DIAGNOSIS — F4312 Post-traumatic stress disorder, chronic: Secondary | ICD-10-CM

## 2023-11-16 DIAGNOSIS — E079 Disorder of thyroid, unspecified: Secondary | ICD-10-CM | POA: Diagnosis not present

## 2023-11-16 DIAGNOSIS — Z1231 Encounter for screening mammogram for malignant neoplasm of breast: Secondary | ICD-10-CM

## 2023-11-16 DIAGNOSIS — Z124 Encounter for screening for malignant neoplasm of cervix: Secondary | ICD-10-CM

## 2023-11-16 LAB — LP+LDL DIRECT

## 2023-11-16 MED ORDER — TRAZODONE HCL 100 MG PO TABS: 100.0000 mg | ORAL_TABLET | Freq: Every day | ORAL | 3 refills | Status: AC

## 2023-11-16 MED ORDER — CITALOPRAM HYDROBROMIDE 40 MG PO TABS: 40.0000 mg | ORAL_TABLET | Freq: Every day | ORAL | 3 refills | Status: AC

## 2023-11-16 MED ORDER — BUPROPION HCL ER (XL) 150 MG PO TB24: 150.0000 mg | ORAL_TABLET | Freq: Every day | ORAL | 1 refills | Status: AC

## 2023-11-16 NOTE — Patient Instructions (Signed)
 I have sent the Wellbutrin  in for you to try. If this does not work well for you, please let me know.

## 2023-11-16 NOTE — Assessment & Plan Note (Signed)
 Currently on citalopram  and trazodone . Discontinued Adderall due to side effects (xerostomia and bruxism). Desires to manage medications through primary care due to insurance constraints. Interested in Wellbutrin  for ADHD and appetite suppression. Discussed Wellbutrin 's benefits, including minimal side effects and potential improvement in focus and attention. Wellbutrin  is generally well-tolerated and has a rapid onset. Starting with the once-daily formulation as she is not sensitive to medications. - Prescribe Wellbutrin  for ADHD and mood management - Continue citalopram  and trazodone  - Monitor response to Wellbutrin  and adjust as necessary

## 2023-11-16 NOTE — Assessment & Plan Note (Addendum)
 Discontinued Adderall due to side effects (xerostomia and bruxism). Desires to manage medications through primary care due to insurance constraints. Interested in Wellbutrin  for ADHD and appetite suppression. Discussed Wellbutrin 's benefits, including minimal side effects and potential improvement in focus and attention. Wellbutrin  is generally well-tolerated and has a rapid onset. Starting with the once-daily formulation as she is not sensitive to medications. We discussed consideration of Vyvanse if wellbutrin  is not effective enough.  - Prescribe Wellbutrin  for ADHD and mood management

## 2023-11-16 NOTE — Assessment & Plan Note (Signed)
 CPE completed today. Review of HM activities and recommendations discussed and provided on AVS. Anticipatory guidance, diet, and exercise recommendations provided. Medications, allergies, and hx reviewed and updated as necessary. Orders placed as listed below.  Plan: - Labs ordered. Will make changes as necessary based on results.  - I will review these results and send recommendations via MyChart or a telephone call.  - Mammogram ordered today for breast center.  - Pap completed today. - F/U with CPE in 1 year or sooner for acute/chronic health needs as directed.

## 2023-11-16 NOTE — Assessment & Plan Note (Signed)
 Experiences occasional choking. Famotidine effectively manages symptoms. No dysphagia or throat fullness reported. - Continue famotidine as needed for GERD symptoms

## 2023-11-16 NOTE — Progress Notes (Signed)
 Catheline Doing, DNP, AGNP-c [facility] [address] [place], [place] [address] Main Office [phone]  BP 122/74   Pulse 78   Ht 5' 6.25 (1.683 m)   Wt 207 lb 6.4 oz (94.1 kg)   LMP  (LMP Unknown)   BMI 33.22 kg/m    [name] Subjective:    Patient ID: Shannon Richardson, female    DOB: [birth year], 43 y.o.   MRN: [id number]  HPI: History of Present Illness Shannon Richardson is a 43 year old female who presents for a routine physical exam and medication management. She is due for her pap today.   She has no vaginal symptoms or irritation. Her menstrual periods have ceased with her IUD. She has not had a mammogram yet but performs regular breast self-exams with no new lumps or bumps noted.  She is currently taking citalopram  40 mg and trazodone  100 mg. She has stopped taking Adderall due to side effects such as dry mouth and teeth grinding. She feels less productive without it but is managing. Her mood is stable and possibly improved since discontinuing Adderall. She is interested in exploring Wellbutrin  as a potential treatment for ADHD and as an appetite suppressant.    She has recently started hormone replacement therapy with a cream containing estradiol and progesterone, which she has been using for a week. She notes a reduction in a nasal hump and some hair regrowth.   No shortness of breath, chest pain, dizziness, changes in bowel or bladder habits, or any new hearing or vision issues. She has recently started using reading glasses.  Her family history includes thyroid  problems, as her mother has mentioned she might have similar issues due to hair loss. She recalls a previous thyroid  test that was abnormal.  Socially, she has a busy household with four cats and a dog. She enjoys swimming and recently vacationed in the mountains.    Pertinent items are noted in HPI.   Most Recent Depression Screen:     11/16/2023    1:41 PM 04/23/2015    10:58 AM 03/12/2015   11:04 AM  Depression screen PHQ 2/9  Decreased Interest 0 0 0  Down, Depressed, Hopeless 0 0 0  PHQ - 2 Score 0 0 0   Most Recent Anxiety Screen:      No data to display         Most Recent Fall Screen:    11/16/2023    1:41 PM 01/24/2021   12:52 PM 04/23/2015   10:58 AM 03/12/2015   11:04 AM  Fall Risk   Falls in the past year? 0 0 No  No   Number falls in past yr: 0 0    Injury with Fall? 0 0    Risk for fall due to : No Fall Risks No Fall Risks    Follow up Falls evaluation completed Falls evaluation completed;Education provided;Falls prevention discussed        Data saved with a previous flowsheet row definition    Past medical history, surgical history, medications, allergies, family history and social history reviewed with patient today and changes made to appropriate areas of the chart.  Past Medical History:  Past Medical History:  Diagnosis Date   Allergy    Anemia    Anxiety    Cancer (HCC) 2011   Melanoma   Constipation 01/24/2021   Depression    Encounter for medical examination to establish care 01/24/2021   Fatigue 01/24/2021   GERD (gastroesophageal  reflux disease)    Hx of varicella    Postoperative state 07/19/2015   Postpartum care following cesarean delivery (3/10) 07/20/2015   Pregnancy induced hypertension    Streptococcal pharyngitis 05/08/2022   Medications:  Current Outpatient Medications on File Prior to Visit  Medication Sig   levonorgestrel  (MIRENA ) 20 MCG/24HR IUD 1 each by Intrauterine route once.   No current facility-administered medications on file prior to visit.   Surgical History:  Past Surgical History:  Procedure Laterality Date   CESAREAN SECTION N/A 07/19/2015   Procedure: CESAREAN SECTION;  Surgeon: Marie-Lyne Lavoie, MD;  Location: [facility];  Service: Obstetrics;  Laterality: N/A;   MELANOMA EXCISION     mirena  iud     inserted 2017, removal & re-insertion 11-08-20   Allergies:  Allergies   Allergen Reactions   Adhesive [Tape] Rash   Family History:  Family History  Problem Relation Age of Onset   Bipolar disorder Mother    Depression Mother    Drug abuse Mother    Obesity Mother    Hypertension Father    Anxiety disorder Father    COPD Maternal Grandmother    Cancer Maternal Grandmother        lung   ADD / ADHD Sister    Alcohol abuse Sister    Arthritis Sister    Miscarriages / Stillbirths Sister        Objective:    BP 122/74   Pulse 78   Ht 5' 6.25 (1.683 m)   Wt 207 lb 6.4 oz (94.1 kg)   LMP  (LMP Unknown)   BMI 33.22 kg/m   Wt Readings from Last 3 Encounters:  11/16/23 207 lb 6.4 oz (94.1 kg)  03/24/22 195 lb (88.5 kg)  02/10/21 196 lb 12.8 oz (89.3 kg)    Physical Exam Vitals and nursing note reviewed. Chaperone present: declined.  Constitutional:      General: She is not in acute distress.    Appearance: Normal appearance.  HENT:     Head: Normocephalic and atraumatic.     Right Ear: Hearing, tympanic membrane, ear canal and external ear normal.     Left Ear: Hearing, tympanic membrane, ear canal and external ear normal.     Nose: Nose normal.     Right Sinus: No maxillary sinus tenderness or frontal sinus tenderness.     Left Sinus: No maxillary sinus tenderness or frontal sinus tenderness.     Mouth/Throat:     Lips: Pink.     Mouth: Mucous membranes are moist.     Pharynx: Oropharynx is clear.  Eyes:     General: Lids are normal. Vision grossly intact.     Extraocular Movements: Extraocular movements intact.     Conjunctiva/sclera: Conjunctivae normal.     Pupils: Pupils are equal, round, and reactive to light.     Funduscopic exam:    Right eye: No hemorrhage. Red reflex present.        Left eye: No hemorrhage. Red reflex present.    Visual Fields: Right eye visual fields normal and left eye visual fields normal.  Neck:     Thyroid : No thyromegaly.     Vascular: No carotid bruit.  Cardiovascular:     Rate and Rhythm: Normal  rate and regular rhythm.     Chest Wall: PMI is not displaced.     Pulses: Normal pulses.     Heart sounds: Normal heart sounds. No murmur heard. Pulmonary:     Effort: Pulmonary  effort is normal. No respiratory distress.     Breath sounds: Normal breath sounds.  Chest:     Chest wall: No mass, deformity or tenderness.  Breasts:    Breasts are symmetrical.     Right: Normal.     Left: Normal.  Abdominal:     General: Bowel sounds are normal. There is no distension or abdominal bruit.     Palpations: Abdomen is soft. There is no hepatomegaly, splenomegaly or mass.     Tenderness: There is no abdominal tenderness. There is no right CVA tenderness, left CVA tenderness or guarding.     Hernia: No hernia is present. There is no hernia in the left inguinal area or right inguinal area.  Genitourinary:    General: Normal vulva.     Exam position: Lithotomy position.     Pubic Area: No rash.      Tanner stage (genital): 5.     Labia:        Right: No rash or tenderness.        Left: No rash.      Urethra: No prolapse or urethral swelling.     Vagina: Normal. No vaginal discharge.     Cervix: Normal.     Uterus: Normal.      Adnexa: Right adnexa normal and left adnexa normal.     Rectum: Normal.  Musculoskeletal:        General: Normal range of motion.     Cervical back: Full passive range of motion without pain, normal range of motion and neck supple. No tenderness.     Right lower leg: No edema.     Left lower leg: No edema.  Feet:     Right foot:     Skin integrity: Skin integrity normal.     Toenail Condition: Right toenails are normal.     Left foot:     Skin integrity: Skin integrity normal.     Toenail Condition: Left toenails are normal.  Lymphadenopathy:     Cervical: No cervical adenopathy.     Upper Body:     Right upper body: No supraclavicular, axillary or pectoral adenopathy.     Left upper body: No supraclavicular, axillary or pectoral adenopathy.     Lower Body:  No right inguinal adenopathy.  Skin:    General: Skin is warm and dry.     Capillary Refill: Capillary refill takes less than 2 seconds.     Nails: There is no clubbing.  Neurological:     General: No focal deficit present.     Mental Status: She is alert and oriented to person, place, and time.     Cranial Nerves: No cranial nerve deficit.     Sensory: Sensation is intact. No sensory deficit.     Motor: Motor function is intact. No weakness.     Coordination: Coordination is intact. Coordination normal.     Gait: Gait is intact.  Psychiatric:        Attention and Perception: Attention normal.        Mood and Affect: Mood normal.        Speech: Speech normal.        Behavior: Behavior normal. Behavior is cooperative.        Thought Content: Thought content normal.        Cognition and Memory: Cognition and memory normal.        Judgment: Judgment normal.      Results for orders placed or performed  in visit on 02/10/21  POCT URINALYSIS DIP (CLINITEK)   Collection Time: 02/10/21 11:26 AM  Result Value Ref Range   Color, UA yellow yellow   Clarity, UA clear clear   Glucose, UA negative negative mg/dL   Bilirubin, UA negative negative   Ketones, POC UA negative negative mg/dL   Spec Grav, UA 8.974 8.989 - 1.025   Blood, UA trace-intact (A) negative   pH, UA 7.5 5.0 - 8.0   POC PROTEIN,UA negative negative, trace   Urobilinogen, UA 0.2 0.2 or 1.0 E.U./dL   Nitrite, UA Negative Negative   Leukocytes, UA Negative Negative       Assessment & Plan:   Problem List Items Addressed This Visit     Disorder of thyroid    Previous thyroid  testing showed abnormalities, but subsequent tests were normal. We will recheck the levels today to ensure no concerns. Patient does mention hair loss as a concern.       Relevant Orders   TSH   T4, free   Attention deficit disorder   Discontinued Adderall due to side effects (xerostomia and bruxism). Desires to manage medications through  primary care due to insurance constraints. Interested in Wellbutrin  for ADHD and appetite suppression. Discussed Wellbutrin 's benefits, including minimal side effects and potential improvement in focus and attention. Wellbutrin  is generally well-tolerated and has a rapid onset. Starting with the once-daily formulation as she is not sensitive to medications. We discussed consideration of Vyvanse if wellbutrin  is not effective enough.  - Prescribe Wellbutrin  for ADHD and mood management      Relevant Medications   buPROPion  (WELLBUTRIN  XL) 150 MG 24 hr tablet   Encounter for annual physical exam - Primary   CPE completed today. Review of HM activities and recommendations discussed and provided on AVS. Anticipatory guidance, diet, and exercise recommendations provided. Medications, allergies, and hx reviewed and updated as necessary. Orders placed as listed below.  Plan: - Labs ordered. Will make changes as necessary based on results.  - I will review these results and send recommendations via MyChart or a telephone call.  - Mammogram ordered today for breast center.  - Pap completed today. - F/U with CPE in 1 year or sooner for acute/chronic health needs as directed.        Choking   Experiences occasional choking. Famotidine effectively manages symptoms. No dysphagia or throat fullness reported. - Continue famotidine as needed for GERD symptoms       Post-traumatic stress disorder, chronic   Currently on citalopram  and trazodone . Discontinued Adderall due to side effects (xerostomia and bruxism). Desires to manage medications through primary care due to insurance constraints. Interested in Wellbutrin  for ADHD and appetite suppression. Discussed Wellbutrin 's benefits, including minimal side effects and potential improvement in focus and attention. Wellbutrin  is generally well-tolerated and has a rapid onset. Starting with the once-daily formulation as she is not sensitive to medications. -  Prescribe Wellbutrin  for ADHD and mood management - Continue citalopram  and trazodone  - Monitor response to Wellbutrin  and adjust as necessary      Relevant Medications   citalopram  (CELEXA ) 40 MG tablet   traZODone  (DESYREL ) 100 MG tablet   buPROPion  (WELLBUTRIN  XL) 150 MG 24 hr tablet   Social phobia, generalized   Currently on citalopram  and trazodone . Discontinued Adderall due to side effects (xerostomia and bruxism). Desires to manage medications through primary care due to insurance constraints. Interested in Wellbutrin  for ADHD and appetite suppression. Discussed Wellbutrin 's benefits, including minimal side effects and potential  improvement in focus and attention. Wellbutrin  is generally well-tolerated and has a rapid onset. Starting with the once-daily formulation as she is not sensitive to medications. - Prescribe Wellbutrin  for ADHD and mood management - Continue citalopram  and trazodone  - Monitor response to Wellbutrin  and adjust as necessary      Relevant Medications   citalopram  (CELEXA ) 40 MG tablet   traZODone  (DESYREL ) 100 MG tablet   buPROPion  (WELLBUTRIN  XL) 150 MG 24 hr tablet   Other Visit Diagnoses       Papanicolaou smear for cervical cancer screening       Relevant Orders   Cytology - PAP([facility])     BMI 33.0-33.9,adult       Relevant Orders   CBC with Differential/Platelet   Comprehensive metabolic panel with GFR   TSH   T4, free   LP+LDL Direct   Hemoglobin A1c     Screening mammogram for breast cancer       Relevant Orders   MM 3D SCREENING MAMMOGRAM BILATERAL BREAST         Follow up plan: Return in about 1 year (around 11/15/2024) for CPE.  NEXT PREVENTATIVE PHYSICAL DUE IN 1 YEAR.  PATIENT COUNSELING PROVIDED FOR ALL ADULT PATIENTS: A well balanced diet low in saturated fats, cholesterol, and moderation in carbohydrates.  This can be as simple as monitoring portion sizes and cutting back on sugary beverages such as soda and juice to  start with.    Daily water consumption of at least 64 ounces.  Physical activity at least 180 minutes per week.  If just starting out, start 10 minutes a day and work your way up.   This can be as simple as taking the stairs instead of the elevator and walking 2-3 laps around the office  purposefully every day.   STD protection, partner selection, and regular testing if high risk.  Limited consumption of alcoholic beverages if alcohol is consumed. For men, I recommend no more than 14 alcoholic beverages per week, spread out throughout the week (max 2 per day). Avoid binge drinking or consuming large quantities of alcohol in one setting.  Please let me know if you feel you may need help with reduction or quitting alcohol consumption.   Avoidance of nicotine, if used. Please let me know if you feel you may need help with reduction or quitting nicotine use.   Daily mental health attention. This can be in the form of 5 minute daily meditation, prayer, journaling, yoga, reflection, etc.  Purposeful attention to your emotions and mental state can significantly improve your overall wellbeing  and  Health.  Please know that I am here to help you with all of your health care goals and am happy to work with you to find a solution that works best for you.  The greatest advice I have received with any changes in life are to take it one step at a time, that even means if all you can focus on is the next 60 seconds, then do that and celebrate your victories.  With any changes in life, you will have set backs, and that is OK. The important thing to remember is, if you have a set back, it is not a failure, it is an opportunity to try again! Screening Testing Mammogram Every 1 -2 years based on history and risk factors Starting at age 4 Pap Smear Ages 21-39 every 3 years Ages 64-65 every 5 years with HPV testing More frequent testing may be  required based on results and history Colon Cancer  Screening Every 1-10 years based on test performed, risk factors, and history Starting at age 8 Bone Density Screening Every 2-10 years based on history Starting at age 63 for women Recommendations for men differ based on medication usage, history, and risk factors AAA Screening One time ultrasound Men 61-50 years old who have every smoked Lung Cancer Screening Low Dose Lung CT every 12 months Age 37-80 years with a 30 pack-year smoking history who still smoke or who have quit within the last 15 years   Screening Labs Routine  Labs: Complete Blood Count (CBC), Complete Metabolic Panel (CMP), Cholesterol (Lipid Panel) Every 6-12 months based on history and medications May be recommended more frequently based on current conditions or previous results Hemoglobin A1c Lab Every 3-12 months based on history and previous results Starting at age 12 or earlier with diagnosis of diabetes, high cholesterol, BMI >26, and/or risk factors Frequent monitoring for patients with diabetes to ensure blood sugar control Thyroid  Panel (TSH) Every 6 months based on history, symptoms, and risk factors May be repeated more often if on medication HIV One time testing for all patients 55 and older May be repeated more frequently for patients with increased risk factors or exposure Hepatitis C One time testing for all patients 13 and older May be repeated more frequently for patients with increased risk factors or exposure Gonorrhea, Chlamydia Every 12 months for all sexually active persons 13-24 years Additional monitoring may be recommended for those who are considered high risk or who have symptoms Every 12 months for any woman on birth control, regardless of sexual activity PSA Men 80-17 years old with risk factors Additional screening may be recommended from age 740-69 based on risk factors, symptoms, and history  Vaccine Recommendations Tetanus Booster All adults every 10 years Flu Vaccine All  patients 6 months and older every year COVID Vaccine All patients 12 years and older Initial dosing with booster May recommend additional booster based on age and health history HPV Vaccine 2 doses all patients age 74-26 Dosing may be considered for patients over 26 Shingles Vaccine (Shingrix) 2 doses all adults 55 years and older Pneumonia (Pneumovax 76) All adults 65 years and older May recommend earlier dosing based on health history One year apart from Prevnar 24 Pneumonia (Prevnar 61) All adults 65 years and older Dosed 1 year after Pneumovax 23 Pneumonia (Prevnar 20) One time alternative to the two dosing of 13 and 23 For all adults with initial dose of 23, 20 is recommended 1 year later For all adults with initial dose of 13, 23 is still recommended as second option 1 year later

## 2023-11-16 NOTE — Assessment & Plan Note (Signed)
 Previous thyroid  testing showed abnormalities, but subsequent tests were normal. We will recheck the levels today to ensure no concerns. Patient does mention hair loss as a concern.

## 2023-11-17 LAB — CBC WITH DIFFERENTIAL/PLATELET
Basophils Absolute: 0 x10E3/uL (ref 0.0–0.2)
Basos: 1 %
EOS (ABSOLUTE): 0.4 x10E3/uL (ref 0.0–0.4)
Eos: 6 %
Hematocrit: 36.8 % (ref 34.0–46.6)
Hemoglobin: 12 g/dL (ref 11.1–15.9)
Immature Grans (Abs): 0 x10E3/uL (ref 0.0–0.1)
Immature Granulocytes: 0 %
Lymphocytes Absolute: 1.9 x10E3/uL (ref 0.7–3.1)
Lymphs: 28 %
MCH: 27.8 pg (ref 26.6–33.0)
MCHC: 32.6 g/dL (ref 31.5–35.7)
MCV: 85 fL (ref 79–97)
Monocytes Absolute: 0.5 x10E3/uL (ref 0.1–0.9)
Monocytes: 8 %
Neutrophils Absolute: 4 x10E3/uL (ref 1.4–7.0)
Neutrophils: 57 %
Platelets: 205 x10E3/uL (ref 150–450)
RBC: 4.32 x10E6/uL (ref 3.77–5.28)
RDW: 13 % (ref 11.7–15.4)
WBC: 6.9 x10E3/uL (ref 3.4–10.8)

## 2023-11-17 LAB — COMPREHENSIVE METABOLIC PANEL WITH GFR
ALT: 19 IU/L (ref 0–32)
AST: 24 IU/L (ref 0–40)
Albumin: 4 g/dL (ref 3.9–4.9)
Alkaline Phosphatase: 101 IU/L (ref 44–121)
BUN/Creatinine Ratio: 13 (ref 9–23)
BUN: 13 mg/dL (ref 6–24)
Bilirubin Total: <0.2 mg/dL (ref 0.0–1.2)
CO2: 20 mmol/L (ref 20–29)
Calcium: 9.3 mg/dL (ref 8.7–10.2)
Chloride: 106 mmol/L (ref 96–106)
Creatinine, Ser: 1.02 mg/dL — AB (ref 0.57–1.00)
Globulin, Total: 3 g/dL (ref 1.5–4.5)
Glucose: 88 mg/dL (ref 70–99)
Potassium: 4.2 mmol/L (ref 3.5–5.2)
Sodium: 139 mmol/L (ref 134–144)
Total Protein: 7 g/dL (ref 6.0–8.5)
eGFR: 71 mL/min/1.73 (ref >59)

## 2023-11-17 LAB — LP+LDL DIRECT
Cholesterol, Total: 151 mg/dL (ref 100–199)
HDL: 44 mg/dL (ref >39)
LDL Chol Calc (NIH): 84 mg/dL (ref 0–99)
LDL Direct: 89 mg/dL (ref 0–99)
Triglycerides: 130 mg/dL (ref 0–149)
VLDL Cholesterol Cal: 23 mg/dL (ref 5–40)

## 2023-11-17 LAB — TSH: TSH: 1.18 u[IU]/mL (ref 0.450–4.500)

## 2023-11-17 LAB — HEMOGLOBIN A1C
Est. average glucose Bld gHb Est-mCnc: 105 mg/dL
Hgb A1c MFr Bld: 5.3 (ref 4.8–5.6)

## 2023-11-17 LAB — T4, FREE: Free T4: 0.84 ng/dL (ref 0.82–1.77)

## 2023-11-23 LAB — CYTOLOGY - PAP
Adequacy: ABSENT
Comment: NEGATIVE
Diagnosis: UNDETERMINED — AB
High risk HPV: POSITIVE — AB

## 2023-11-25 ENCOUNTER — Inpatient Hospital Stay
Admission: RE | Admit: 2023-11-25 | Discharge: 2023-11-25 | Discharge status: Home / Self Care | Source: Ambulatory Visit | Attending: Nurse Practitioner

## 2023-11-25 DIAGNOSIS — Z1231 Encounter for screening mammogram for malignant neoplasm of breast: Secondary | ICD-10-CM

## 2023-12-01 ENCOUNTER — Ambulatory Visit: Payer: Self-pay | Admitting: Nurse Practitioner

## 2023-12-01 DIAGNOSIS — R87620 Atypical squamous cells of undetermined significance on cytologic smear of vagina (ASC-US): Secondary | ICD-10-CM

## 2024-02-19 ENCOUNTER — Other Ambulatory Visit: Payer: Self-pay | Admitting: Medical Genetics

## 2024-02-19 DIAGNOSIS — Z006 Encounter for examination for normal comparison and control in clinical research program: Secondary | ICD-10-CM

## 2024-03-02 ENCOUNTER — Encounter: Payer: Self-pay | Admitting: Obstetrics and Gynecology

## 2024-03-02 ENCOUNTER — Ambulatory Visit (INDEPENDENT_AMBULATORY_CARE_PROVIDER_SITE_OTHER): Admitting: Obstetrics and Gynecology

## 2024-03-02 ENCOUNTER — Other Ambulatory Visit (HOSPITAL_COMMUNITY)
Admission: RE | Admit: 2024-03-02 | Discharge: 2024-03-02 | Disposition: A | Discharge status: Home / Self Care | Source: Ambulatory Visit | Attending: Obstetrics and Gynecology | Admitting: Obstetrics and Gynecology

## 2024-03-02 VITALS — BP 112/62 | HR 71 | Temp 98.0°F | Ht 67.25 in | Wt 205.0 lb

## 2024-03-02 DIAGNOSIS — R87810 Cervical high risk human papillomavirus (HPV) DNA test positive: Secondary | ICD-10-CM | POA: Diagnosis not present

## 2024-03-02 DIAGNOSIS — R87610 Atypical squamous cells of undetermined significance on cytologic smear of cervix (ASC-US): Secondary | ICD-10-CM | POA: Insufficient documentation

## 2024-03-02 DIAGNOSIS — Z01812 Encounter for preprocedural laboratory examination: Secondary | ICD-10-CM

## 2024-03-02 LAB — PREGNANCY, URINE: Preg Test, Ur: NEGATIVE

## 2024-03-02 NOTE — Progress Notes (Signed)
 43 y.o. G36P1001 female with Mirena  IUD (inserted 11/08/2020) here for colposcopy. Single.  No LMP recorded. (Menstrual status: IUD).   New partner since prior last PAP in 2020. HPV neg 2018 Gardasil: complete per patient  Last PAP:    Component Value Date/Time   DIAGPAP (A) 11/16/2023 1350    - Atypical squamous cells of undetermined significance (ASC-US )   HPVHIGH Positive (A) 11/16/2023 1350   ADEQPAP  11/16/2023 1350    Satisfactory for evaluation; transformation zone component ABSENT.   GYN HISTORY: No sig hx  OB History  Gravida Para Term Preterm AB Living  1 1 1   1   SAB IAB Ectopic Multiple Live Births     0 1    # Outcome Date GA Lbr Len/2nd Weight Sex Type Anes PTL Lv  1 Term 07/19/15 [redacted]w[redacted]d 17:01 / 06:16 8 lb 11.2 oz (3.945 kg) M CS-LTranv EPI  LIV    Past Medical History:  Diagnosis Date   Allergy    Anemia    Anxiety    Cancer (HCC) 2011   Melanoma   Constipation 01/24/2021   Depression    Encounter for medical examination to establish care 01/24/2021   Fatigue 01/24/2021   GERD (gastroesophageal reflux disease)    Hx of varicella    Postoperative state 07/19/2015   Postpartum care following cesarean delivery (3/10) 07/20/2015   Pregnancy induced hypertension    Streptococcal pharyngitis 05/08/2022    Past Surgical History:  Procedure Laterality Date   CESAREAN SECTION N/A 07/19/2015   Procedure: CESAREAN SECTION;  Surgeon: Marie-Lyne Lavoie, MD;  Location: [facility];  Service: Obstetrics;  Laterality: N/A;   MELANOMA EXCISION     mirena  iud     inserted 2017, removal & re-insertion 11-08-20    Current Outpatient Medications on File Prior to Visit  Medication Sig Dispense Refill   buPROPion  (WELLBUTRIN  XL) 150 MG 24 hr tablet Take 1 tablet (150 mg total) by mouth daily. 90 tablet 1   Cetirizine HCl (ZYRTEC PO) Take by mouth.     citalopram  (CELEXA ) 40 MG tablet Take 1 tablet (40 mg total) by mouth daily. 90 tablet 3   levonorgestrel  (MIRENA ) 20  MCG/24HR IUD 1 each by Intrauterine route once.     traZODone  (DESYREL ) 100 MG tablet Take 1 tablet (100 mg total) by mouth at bedtime. 90 tablet 3   No current facility-administered medications on file prior to visit.    Allergies  Allergen Reactions   Adhesive [Tape] Rash      PE Today's Vitals   03/02/24 1107  BP: 112/62  Pulse: 71  Temp: 98 F (36.7 C)  TempSrc: Oral  SpO2: 98%  Weight: 205 lb (93 kg)  Height: 5' 7.25 (1.708 m)   Body mass index is 31.87 kg/m.  Physical Exam Vitals reviewed. Exam conducted with a chaperone present.  Constitutional:      General: She is not in acute distress.    Appearance: Normal appearance.  HENT:     Head: Normocephalic and atraumatic.     Nose: Nose normal.  Eyes:     Extraocular Movements: Extraocular movements intact.     Conjunctiva/sclera: Conjunctivae normal.  Pulmonary:     Effort: Pulmonary effort is normal.  Genitourinary:    General: Normal vulva.     Exam position: Lithotomy position.     Vagina: Normal. No vaginal discharge.     Cervix: Normal. No cervical motion tenderness, discharge or lesion.     Uterus:  Normal. Not enlarged and not tender.      Adnexa: Right adnexa normal and left adnexa normal.  Musculoskeletal:        General: Normal range of motion.     Cervical back: Normal range of motion.  Neurological:     General: No focal deficit present.     Mental Status: She is alert.  Psychiatric:        Mood and Affect: Mood normal.        Behavior: Behavior normal.      Colposcopy Procedure Consented for procedure.  Time out performed. Speculum placed in vagina.  Acetic acid 3% was applied to cervix.  Satisfactory colposcopy. TZ was completely seen. Hurricaine anesthetic spray was applied. Biopsies taken Yes.   Location(s) - 10:00  Findings: +AW ECC was performed.  Specimens to pathology separately.  Monsel's applied to biopsy areas.   Good hemostasis.  Minimal EBL. No complications.   Tolerated well.     Assessment and Plan:        ASCUS with positive high risk HPV cervical -     Surgical pathology  Pre-procedure lab exam -     Pregnancy, urine  Abnormal PAP results reviewed. ASCCP guidelines reviewed. Consents signed. Satisfactory colposcopy, ECC and biopsy performed. Aftercare instructions provided.  Will call with results. I also recommend completion of Gardasil vaccine up to age 45yo, avoidance of smoking, and use of condoms with new sexual partners to prevent progression of disease.   Vera LULLA Pa, MD

## 2024-03-02 NOTE — Patient Instructions (Signed)
 It is common to have vaginal bleeding and cramping for up to 72 hours after your biopsy. Please call our office with heavy vaginal bleeding, severe abdominal pain or fever. Avoid intercourse, tampon use, douching and baths for 7 days to decrease the risk of infection.

## 2024-03-06 LAB — SURGICAL PATHOLOGY

## 2024-03-07 ENCOUNTER — Ambulatory Visit: Payer: Self-pay | Admitting: Obstetrics and Gynecology

## 2024-12-05 ENCOUNTER — Encounter: Payer: Self-pay | Admitting: Nurse Practitioner
